# Patient Record
Sex: Female | Born: 1995 | Hispanic: Yes | Marital: Single | Smoking: Never smoker
Health system: Southern US, Community
[De-identification: ages and names within clinical notes are randomized; demographics above are authoritative.]

## PROBLEM LIST (undated history)

## (undated) ENCOUNTER — Inpatient Hospital Stay (HOSPITAL_COMMUNITY): Payer: Self-pay

## (undated) DIAGNOSIS — O24419 Gestational diabetes mellitus in pregnancy, unspecified control: Secondary | ICD-10-CM

## (undated) DIAGNOSIS — Z789 Other specified health status: Secondary | ICD-10-CM

## (undated) HISTORY — DX: Gestational diabetes mellitus in pregnancy, unspecified control: O24.419

## (undated) HISTORY — PX: NO PAST SURGERIES: SHX2092

---
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
## 2016-07-25 ENCOUNTER — Encounter: Payer: Self-pay | Admitting: Pediatrics

## 2016-07-26 ENCOUNTER — Encounter: Payer: Self-pay | Admitting: Pediatrics

## 2016-12-13 LAB — OB RESULTS CONSOLE HIV ANTIBODY (ROUTINE TESTING): HIV: NONREACTIVE

## 2016-12-13 LAB — OB RESULTS CONSOLE RUBELLA ANTIBODY, IGM: Rubella: IMMUNE

## 2016-12-13 LAB — OB RESULTS CONSOLE RPR: RPR: NONREACTIVE

## 2016-12-13 LAB — OB RESULTS CONSOLE HEPATITIS B SURFACE ANTIGEN: HEP B S AG: NEGATIVE

## 2016-12-13 LAB — OB RESULTS CONSOLE GC/CHLAMYDIA
CHLAMYDIA, DNA PROBE: NEGATIVE
GC PROBE AMP, GENITAL: NEGATIVE

## 2016-12-31 NOTE — L&D Delivery Note (Signed)
22 y.o. G2P1001 at 4569w4d admitted for SOL with SROM and Pitocin to augment, delivered a viable female infant at 1730 in cephalic, OA position. No nuchal cord. Right anterior shoulder delivered with ease. 60 sec delayed cord clamping. Cord clamped x2 and cut. Placenta delivered spontaneously intact, with 3VC. Fundus firm on exam with massage and pitocin. Good hemostasis noted.  Anesthesia: None Laceration: Second Degree Perineal Suture: 3.0 Vicryl Good hemostasis noted. EBL: 100 cc  Mom and baby recovering in LDR.    Apgars: APGAR (1 MIN): 9   APGAR (5 MINS): 9    Weight: Pending skin to skin  Sponge and instrument count were correct x2. Placenta sent to L&D.  Girl, Breast feeding, Condoms for contraception.  [place] Shirley, DO FM Resident PGY-1 07/04/2017 6:14 PM   OB FELLOW DELIVERY ATTESTATION  I was gloved and present for the delivery in its entirety, and I agree with the above resident's note.    Jen MowElizabeth Raheem Kolbe, DO OB Fellow

## 2017-06-17 LAB — OB RESULTS CONSOLE GBS: GBS: NEGATIVE

## 2017-06-28 ENCOUNTER — Inpatient Hospital Stay (HOSPITAL_COMMUNITY)
Admission: AD | Admit: 2017-06-28 | Discharge: 2017-06-29 | Disposition: A | Discharge status: Home / Self Care | Payer: Self-pay | Source: Ambulatory Visit | Attending: Obstetrics & Gynecology | Admitting: Obstetrics & Gynecology

## 2017-06-28 ENCOUNTER — Encounter (HOSPITAL_COMMUNITY): Payer: Self-pay | Admitting: *Deleted

## 2017-06-28 DIAGNOSIS — Z3A38 38 weeks gestation of pregnancy: Secondary | ICD-10-CM | POA: Insufficient documentation

## 2017-06-28 DIAGNOSIS — O368130 Decreased fetal movements, third trimester, not applicable or unspecified: Secondary | ICD-10-CM | POA: Insufficient documentation

## 2017-06-28 NOTE — MAU Note (Signed)
Has not felt FM since 0100. Denies LOF or bleeding. No pain

## 2017-06-29 DIAGNOSIS — O368130 Decreased fetal movements, third trimester, not applicable or unspecified: Secondary | ICD-10-CM

## 2017-06-29 NOTE — Discharge Instructions (Signed)
Evaluación de los movimientos fetales   (Fetal Movement Counts)  Nombre del paciente: __________________________________________________ Fecha de parto estimada: ____________________  La evaluación de los movimientos fetales es muy recomendable en los embarazos de alto riesgo, pero también es una buena idea que lo hagan todas las embarazadas. El médico le indicará que comience a contarlos a las 28 semanas de embarazo. Los movimientos fetales suelen aumentar:   · Después de una comida completa.  · Después de la actividad física.  · Después de comer o beber algo dulce o frío.  · En reposo.  Preste atención cuando sienta que el bebé está más activo. Esto le ayudará a notar un patrón de ciclos de vigilia y sueño de su bebé y cuáles son los factores que contribuyen a un aumento de los movimientos fetales. Es importante llevar a cabo un recuento de movimientos fetales, al mismo tiempo cada día, cuando el bebé normalmente está más activo.   CÓMO CONTAR LOS MOVIMIENTOS FETALES  1. Busque un lugar tranquilo y cómodo para sentarse o recostarse sobre el lado izquierdo. Al recostarse sobre su lado izquierdo, le proporciona una mejor circulación de sangre y oxígeno al bebé.  2. Anote el día y la hora en una hoja de papel o en un diario.  3. Comience contando las pataditas, revoloteos, chasquidos, vueltas o pinchazos en un período de 2 horas. Debe sentir al menos 10 movimientos en 2 horas.  4. Si no siente 10 movimientos en 2 horas, espere 2 ó 3 horas y cuente de nuevo. Busque cambios en el patrón o si no cuenta lo suficiente en 2 horas.  SOLICITE ATENCIÓN MÉDICA SI:   · Siente menos de 10 pataditas en 2 horas, en dos intentos.  · No hay movimientos durante una hora.  · El patrón se modifica o le lleva más tiempo cada día contar las 10 pataditas.  · Siente que el bebé no se mueve como lo hace habitualmente.  Fecha: ____________ Movimientos: ____________ Hora de inicio: ____________ Hora de finalización: ____________   Fecha:  ____________ Movimientos: ____________ Hora de inicio: ____________ Hora de finalización: ____________   Fecha: ____________ Movimientos: ____________ Hora de inicio: ____________ Hora de finalización: ____________   Fecha: ____________ Movimientos: ____________ Hora de inicio: ____________ Hora de finalización: ____________   Fecha: ____________ Movimientos: ____________ Hora de inicio: ____________ Hora de finalización: ____________   Fecha: ____________ Movimientos: ____________ Hora de inicio: ____________ Hora de finalización: ____________   Fecha: ____________ Movimientos: ____________ Hora de inicio: ____________ Hora de finalización: ____________   Fecha: ____________ Movimientos: ____________ Hora de inicio: ____________ Hora de finalización: ____________   Fecha: ____________ Movimientos: ____________ Hora de inicio: ____________ Hora de finalización: ____________   Fecha: ____________ Movimientos: ____________ Hora de inicio: ____________ Hora de finalización: ____________   Fecha: ____________ Movimientos: ____________ Hora de inicio: ____________ Hora de finalización: ____________   Fecha: ____________ Movimientos: ____________ Hora de inicio: ____________ Hora de finalización: ____________   Fecha: ____________ Movimientos: ____________ Hora de inicio: ____________ Hora de finalización: ____________   Fecha: ____________ Movimientos: ____________ Hora de inicio: ____________ Hora de finalización: ____________   Fecha: ____________ Movimientos: ____________ Hora de inicio: ____________ Hora de finalización: ____________   Fecha: ____________ Movimientos: ____________ Hora de inicio: ____________ Hora de finalización: ____________   Fecha: ____________ Movimientos: ____________ Hora de inicio: ____________ Hora de finalización: ____________   Fecha: ____________ Movimientos: ____________ Hora de inicio: ____________ Hora de finalización: ____________   Fecha: ____________ Movimientos: ____________ Hora  de inicio: ____________ Hora de finalización: ____________     Fecha: ____________ Movimientos: ____________ Hora de inicio: ____________ Hora de finalización: ____________   Fecha: ____________ Movimientos: ____________ Hora de inicio: ____________ Hora de finalización: ____________   Fecha: ____________ Movimientos: ____________ Hora de inicio: ____________ Hora de finalización: ____________   Fecha: ____________ Movimientos: ____________ Hora de inicio: ____________ Hora de finalización: ____________   Fecha: ____________ Movimientos: ____________ Hora de inicio: ____________ Hora de finalización: ____________   Fecha: ____________ Movimientos: ____________ Hora de inicio: ____________ Hora de finalización: ____________   Fecha: ____________ Movimientos: ____________ Hora de inicio: ____________ Hora de finalización: ____________   Fecha: ____________ Movimientos: ____________ Hora de inicio: ____________ Hora de finalización: ____________   Fecha: ____________ Movimientos: ____________ Hora de inicio: ____________ Hora de finalización: ____________   Fecha: ____________ Movimientos: ____________ Hora de inicio: ____________ Hora de finalización: ____________   Fecha: ____________ Movimientos: ____________ Hora de inicio: ____________ Hora de finalización: ____________   Fecha: ____________ Movimientos: ____________ Hora de inicio: ____________ Hora de finalización: ____________   Fecha: ____________ Movimientos: ____________ Hora de inicio: ____________ Hora de finalización: ____________   Fecha: ____________ Movimientos: ____________ Hora de inicio: ____________ Hora de finalización: ____________   Fecha: ____________ Movimientos: ____________ Hora de inicio: ____________ Hora de finalización: ____________   Fecha: ____________ Movimientos: ____________ Hora de inicio: ____________ Hora de finalización: ____________   Fecha: ____________ Movimientos: ____________ Hora de inicio: ____________ Hora de finalización:  ____________   Fecha: ____________ Movimientos: ____________ Hora de inicio: ____________ Hora de finalización: ____________   Fecha: ____________ Movimientos: ____________ Hora de inicio: ____________ Hora de finalización: ____________   Fecha: ____________ Movimientos: ____________ Hora de inicio: ____________ Hora de finalización: ____________   Fecha: ____________ Movimientos: ____________ Hora de inicio: ____________ Hora de finalización: ____________   Fecha: ____________ Movimientos: ____________ Hora de inicio: ____________ Hora de finalización: ____________   Fecha: ____________ Movimientos: ____________ Hora de inicio: ____________ Hora de finalización: ____________   Fecha: ____________ Movimientos: ____________ Hora de inicio: ____________ Hora de finalización: ____________   Fecha: ____________ Movimientos: ____________ Hora de inicio: ____________ Hora de finalización: ____________   Fecha: ____________ Movimientos: ____________ Hora de inicio: ____________ Hora de finalización: ____________   Fecha: ____________ Movimientos: ____________ Hora de inicio: ____________ Hora de finalización: ____________   Fecha: ____________ Movimientos: ____________ Hora de inicio: ____________ Hora de finalización: ____________   Fecha: ____________ Movimientos: ____________ Hora de inicio: ____________ Hora de finalización: ____________   Fecha: ____________ Movimientos: ____________ Hora de inicio: ____________ Hora de finalización: ____________   Fecha: ____________ Movimientos: ____________ Hora de inicio: ____________ Hora de finalización: ____________   Fecha: ____________ Movimientos: ____________ Hora de inicio: ____________ Hora de finalización: ____________   Fecha: ____________ Movimientos: ____________ Hora de inicio: ____________ Hora de finalización: ____________   Fecha: ____________ Movimientos: ____________ Hora de inicio: ____________ Hora de finalización: ____________   Fecha: ____________  Movimientos: ____________ Hora de inicio: ____________ Hora de finalización: ____________   Fecha: ____________ Movimientos: ____________ Hora de inicio: ____________ Hora de finalización: ____________   Fecha: ____________ Movimientos: ____________ Hora de inicio: ____________ Hora de finalización: ____________   Esta información no tiene como fin reemplazar el consejo del médico. Asegúrese de hacerle al médico cualquier pregunta que tenga.  Document Released: 03/25/2008 Document Revised: 12/03/2012  Elsevier Interactive Patient Education © 2017 Elsevier Inc.

## 2017-06-29 NOTE — MAU Provider Note (Signed)
Obstetric Attending MAU Note  Chief Complaint:  Decreased Fetal Movement   First Provider Initiated Contact with Patient 06/29/17 0010     HPI: Elaine Green is a 22 y.o. G2P1001 at [redacted]w[redacted]d who presents to maternity admissions reporting decreased fetal movement today.  Denies contractions, leakage of fluid or vaginal bleeding.   Pregnancy Course: Receives care at [facility] Patient Active Problem List   Diagnosis Date Noted  . Contraception management 10/23/2013    Past Medical History:  Diagnosis Date  . Medical history non-contributory     OB History  Gravida Para Term Preterm AB Living  2 1 1     1   SAB TAB Ectopic Multiple Live Births          1    # Outcome Date GA Lbr Len/2nd Weight Sex Delivery Anes PTL Lv  2 Current           1 Term 10/14/13 [redacted]w[redacted]d 19:17 / 00:34 7 lb 0.4 oz (3.185 kg) F Vag-Spont Local  LIV      Past Surgical History:  Procedure Laterality Date  . NO PAST SURGERIES      Family History: History reviewed. No pertinent family history.  Social History: Social History  Substance Use Topics  . Smoking status: Never Smoker  . Smokeless tobacco: Never Used  . Alcohol use No    Allergies: No Known Allergies  Prescriptions Prior to Admission  Medication Sig Dispense Refill Last Dose  . Acetaminophen 650 MG TABS Take 1 tablet (650 mg total) by mouth every 6 (six) hours as needed (pain). 40 tablet 0   . chlorhexidine (PERIDEX) 0.12 % solution Use as directed 15 mLs in the mouth or throat 2 (two) times daily. 120 mL 0   . ibuprofen (ADVIL,MOTRIN) 600 MG tablet Take 1 tablet (600 mg total) by mouth every 6 (six) hours. 30 tablet 0 10/30/2013 at Unknown time  . medroxyPROGESTERone (DEPO-PROVERA) 150 MG/ML injection Inject 1 mL (150 mg total) into the muscle once. 1 mL  10-26-13  . Prenatal Vit-Fe Fumarate-FA (MULTIVITAMIN-PRENATAL) 27-0.8 MG TABS tablet Take 1 tablet by mouth daily at 12 noon.   10/30/2013 at Unknown time    ROS: Pertinent  findings in history of present illness.  Physical Exam  Blood pressure 105/65, pulse 81, temperature 98.1 F (36.7 C), resp. rate 16, height 5\' 2"  (1.575 m), weight 219 lb (99.3 kg), currently breastfeeding. CONSTITUTIONAL: Well-developed, well-nourished female in no acute distress.  HENT:  Normocephalic, atraumatic, External right and left ear normal. Oropharynx is clear and moist EYES: Conjunctivae and EOM are normal. Pupils are equal, round, and reactive to light. No scleral icterus.  NECK: Normal range of motion, supple, no masses SKIN: Skin is warm and dry. No rash noted. Not diaphoretic. No erythema. No pallor. NEUROLGIC: Alert and oriented to person, place, and time. Normal reflexes, muscle tone coordination. No cranial nerve deficit noted. PSYCHIATRIC: Normal mood and affect. Normal behavior. Normal judgment and thought content. CARDIOVASCULAR: Normal heart rate noted, regular rhythm RESPIRATORY: Effort and breath sounds normal, no problems with respiration noted ABDOMEN: Soft, nontender, nondistended, gravid appropriate for gestational age MUSCULOSKELETAL: Normal range of motion. No edema and no tenderness. 2+ distal pulses.  SPECULUM EXAM: Deferred    FHT:  Baseline 140 , moderate variability, accelerations present, no decelerations Contractions:None   Labs: No results found for this or any previous visit (from the past 24 hour(s)).  Imaging:  No results found.  MAU Course: Reactive NST, patient started  to feel movement while here.  Assessment: 1. Decreased fetal movements in third trimester, single or unspecified fetus     Plan: Discharge home Labor precautions and fetal kick counts reviewed Follow up with OB provider    Allergies as of 06/29/2017   No Known Allergies     Medication List    TAKE these medications   Acetaminophen 650 MG Tabs Take 1 tablet (650 mg total) by mouth every 6 (six) hours as needed (pain).   chlorhexidine 0.12 %  solution Commonly known as:  PERIDEX Use as directed 15 mLs in the mouth or throat 2 (two) times daily.   ibuprofen 600 MG tablet Commonly known as:  ADVIL,MOTRIN Take 1 tablet (600 mg total) by mouth every 6 (six) hours.   medroxyPROGESTERone 150 MG/ML injection Commonly known as:  DEPO-PROVERA Inject 1 mL (150 mg total) into the muscle once.   multivitamin-prenatal 27-0.8 MG Tabs tablet Take 1 tablet by mouth daily at 12 noon.       Tereso NewcomerAnyanwu, Constance Hackenberg A, MD 06/29/2017 12:12 AM

## 2017-07-04 ENCOUNTER — Inpatient Hospital Stay (HOSPITAL_COMMUNITY)
Admission: AD | Admit: 2017-07-04 | Discharge: 2017-07-06 | DRG: 775 | Disposition: A | Discharge status: Home / Self Care | Payer: Medicaid Other | Source: Ambulatory Visit | Attending: Obstetrics and Gynecology | Admitting: Obstetrics and Gynecology

## 2017-07-04 ENCOUNTER — Encounter (HOSPITAL_COMMUNITY): Payer: Self-pay | Admitting: *Deleted

## 2017-07-04 DIAGNOSIS — O4292 Full-term premature rupture of membranes, unspecified as to length of time between rupture and onset of labor: Secondary | ICD-10-CM | POA: Diagnosis present

## 2017-07-04 DIAGNOSIS — O4290 Premature rupture of membranes, unspecified as to length of time between rupture and onset of labor, unspecified weeks of gestation: Secondary | ICD-10-CM | POA: Diagnosis present

## 2017-07-04 DIAGNOSIS — Z3493 Encounter for supervision of normal pregnancy, unspecified, third trimester: Secondary | ICD-10-CM | POA: Diagnosis present

## 2017-07-04 DIAGNOSIS — Z3A39 39 weeks gestation of pregnancy: Secondary | ICD-10-CM

## 2017-07-04 LAB — CBC
HCT: 36.1 % (ref 36.0–46.0)
HEMOGLOBIN: 12.3 g/dL (ref 12.0–15.0)
MCH: 30.7 pg (ref 26.0–34.0)
MCHC: 34.1 g/dL (ref 30.0–36.0)
MCV: 90 fL (ref 78.0–100.0)
Platelets: 154 10*3/uL (ref 150–400)
RBC: 4.01 MIL/uL (ref 3.87–5.11)
RDW: 14.3 % (ref 11.5–15.5)
WBC: 12.8 10*3/uL — ABNORMAL HIGH (ref 4.0–10.5)

## 2017-07-04 LAB — TYPE AND SCREEN
ABO/RH(D): O POS
ANTIBODY SCREEN: NEGATIVE

## 2017-07-04 LAB — POCT FERN TEST: POCT FERN TEST: POSITIVE

## 2017-07-04 MED ORDER — OXYTOCIN 40 UNITS IN LACTATED RINGERS INFUSION - SIMPLE MED
1.0000 m[IU]/min | INTRAVENOUS | Status: DC
  Administered 2017-07-04: 2 m[IU]/min via INTRAVENOUS
  Filled 2017-07-04: qty 1000

## 2017-07-04 MED ORDER — SOD CITRATE-CITRIC ACID 500-334 MG/5ML PO SOLN: 30.0000 mL | ORAL | Status: DC | PRN

## 2017-07-04 MED ORDER — ONDANSETRON HCL 4 MG/2ML IJ SOLN: 4.0000 mg | INTRAMUSCULAR | Status: DC | PRN

## 2017-07-04 MED ORDER — OXYTOCIN BOLUS FROM INFUSION
500.0000 mL | Freq: Once | INTRAVENOUS | Status: AC
  Administered 2017-07-04: 500 mL via INTRAVENOUS

## 2017-07-04 MED ORDER — COCONUT OIL OIL: 1.0000 "application " | TOPICAL_OIL | Status: DC | PRN

## 2017-07-04 MED ORDER — WITCH HAZEL-GLYCERIN EX PADS: 1.0000 "application " | MEDICATED_PAD | CUTANEOUS | Status: DC | PRN

## 2017-07-04 MED ORDER — LACTATED RINGERS IV SOLN
INTRAVENOUS | Status: DC
  Administered 2017-07-04: 12:00:00 via INTRAVENOUS

## 2017-07-04 MED ORDER — SENNOSIDES-DOCUSATE SODIUM 8.6-50 MG PO TABS
2.0000 | ORAL_TABLET | ORAL | Status: DC
  Administered 2017-07-05 – 2017-07-06 (×2): 2 via ORAL
  Filled 2017-07-04 (×2): qty 2

## 2017-07-04 MED ORDER — DIPHENHYDRAMINE HCL 50 MG/ML IJ SOLN: 12.5000 mg | INTRAMUSCULAR | Status: DC | PRN

## 2017-07-04 MED ORDER — PRENATAL MULTIVITAMIN CH
1.0000 | ORAL_TABLET | Freq: Every day | ORAL | Status: DC
  Administered 2017-07-05: 1 via ORAL
  Filled 2017-07-04: qty 1

## 2017-07-04 MED ORDER — LACTATED RINGERS IV SOLN: 500.0000 mL | Freq: Once | INTRAVENOUS | Status: DC

## 2017-07-04 MED ORDER — EPHEDRINE 5 MG/ML INJ
10.0000 mg | INTRAVENOUS | Status: DC | PRN
  Filled 2017-07-04: qty 2

## 2017-07-04 MED ORDER — DIPHENHYDRAMINE HCL 25 MG PO CAPS: 25.0000 mg | ORAL_CAPSULE | Freq: Four times a day (QID) | ORAL | Status: DC | PRN

## 2017-07-04 MED ORDER — LACTATED RINGERS IV SOLN: INTRAVENOUS | Status: DC

## 2017-07-04 MED ORDER — FLEET ENEMA 7-19 GM/118ML RE ENEM: 1.0000 | ENEMA | RECTAL | Status: DC | PRN

## 2017-07-04 MED ORDER — TETANUS-DIPHTH-ACELL PERTUSSIS 5-2.5-18.5 LF-MCG/0.5 IM SUSP: 0.5000 mL | Freq: Once | INTRAMUSCULAR | Status: DC

## 2017-07-04 MED ORDER — IBUPROFEN 600 MG PO TABS
600.0000 mg | ORAL_TABLET | Freq: Four times a day (QID) | ORAL | Status: DC
  Administered 2017-07-04 – 2017-07-06 (×6): 600 mg via ORAL
  Filled 2017-07-04 (×7): qty 1

## 2017-07-04 MED ORDER — TERBUTALINE SULFATE 1 MG/ML IJ SOLN
0.2500 mg | Freq: Once | INTRAMUSCULAR | Status: DC | PRN
  Filled 2017-07-04: qty 1

## 2017-07-04 MED ORDER — OXYCODONE-ACETAMINOPHEN 5-325 MG PO TABS: 1.0000 | ORAL_TABLET | ORAL | Status: DC | PRN

## 2017-07-04 MED ORDER — ACETAMINOPHEN 325 MG PO TABS: 650.0000 mg | ORAL_TABLET | ORAL | Status: DC | PRN

## 2017-07-04 MED ORDER — LIDOCAINE HCL (PF) 1 % IJ SOLN
30.0000 mL | INTRAMUSCULAR | Status: DC | PRN
  Administered 2017-07-04: 30 mL via SUBCUTANEOUS
  Filled 2017-07-04: qty 30

## 2017-07-04 MED ORDER — ONDANSETRON HCL 4 MG/2ML IJ SOLN: 4.0000 mg | Freq: Four times a day (QID) | INTRAMUSCULAR | Status: DC | PRN

## 2017-07-04 MED ORDER — LACTATED RINGERS IV SOLN: 500.0000 mL | INTRAVENOUS | Status: DC | PRN

## 2017-07-04 MED ORDER — DIBUCAINE 1 % RE OINT: 1.0000 "application " | TOPICAL_OINTMENT | RECTAL | Status: DC | PRN

## 2017-07-04 MED ORDER — OXYCODONE-ACETAMINOPHEN 5-325 MG PO TABS
2.0000 | ORAL_TABLET | ORAL | Status: DC | PRN
Start: 2017-07-04 — End: 2017-07-05

## 2017-07-04 MED ORDER — PHENYLEPHRINE 40 MCG/ML (10ML) SYRINGE FOR IV PUSH (FOR BLOOD PRESSURE SUPPORT)
80.0000 ug | PREFILLED_SYRINGE | INTRAVENOUS | Status: DC | PRN
  Filled 2017-07-04: qty 5

## 2017-07-04 MED ORDER — FENTANYL 2.5 MCG/ML BUPIVACAINE 1/10 % EPIDURAL INFUSION (WH - ANES): 14.0000 mL/h | INTRAMUSCULAR | Status: DC | PRN

## 2017-07-04 MED ORDER — BENZOCAINE-MENTHOL 20-0.5 % EX AERO
1.0000 "application " | INHALATION_SPRAY | CUTANEOUS | Status: DC | PRN
  Administered 2017-07-06: 1 via TOPICAL
  Filled 2017-07-04: qty 56

## 2017-07-04 MED ORDER — ONDANSETRON HCL 4 MG PO TABS: 4.0000 mg | ORAL_TABLET | ORAL | Status: DC | PRN

## 2017-07-04 MED ORDER — OXYTOCIN 40 UNITS IN LACTATED RINGERS INFUSION - SIMPLE MED
2.5000 [IU]/h | INTRAVENOUS | Status: DC
  Administered 2017-07-04: 2.5 [IU]/h via INTRAVENOUS

## 2017-07-04 MED ORDER — SIMETHICONE 80 MG PO CHEW: 80.0000 mg | CHEWABLE_TABLET | ORAL | Status: DC | PRN

## 2017-07-04 MED ORDER — ZOLPIDEM TARTRATE 5 MG PO TABS: 5.0000 mg | ORAL_TABLET | Freq: Every evening | ORAL | Status: DC | PRN

## 2017-07-04 MED ORDER — FENTANYL CITRATE (PF) 100 MCG/2ML IJ SOLN
100.0000 ug | INTRAMUSCULAR | Status: DC | PRN
  Administered 2017-07-04: 100 ug via INTRAVENOUS
  Filled 2017-07-04: qty 2

## 2017-07-04 NOTE — Progress Notes (Signed)
Patient ID: Elaine Green, female   DOB: 03-19-1995, 22 y.o.   MRN: [id number] Labor Progress Note  S: Patient seen & examined for progress of labor. Patient not using epidural.   O: BP (!) 111/49    Pulse 88    Temp 98.5 F (36.9 C) (Oral)    Resp 20    Ht 5\' 2"  (1.575 m)    Wt 98 kg (216 lb)    SpO2 100%    BMI 39.51 kg/m   FHT: 140bpm, mod var, +accels, no decels TOCO: q3-8 min, patient looks comfortable during contractions  CVE: Dilation: 4 (to pt's left side) Effacement (%): 70 Cervical Position: Posterior, Middle Station: -3 Presentation: Vertex Exam by:: Dr. Talbert ForestShirley, Dr. Omer JackMumaw   A&P: 22 y.o. G2P1001 6281w4d   Currently on pitocin 402milli-units/ min Continue expectant management and augmentation Anticipate SVD  [place] Shirley, DO FM Resident PGY-1 07/04/2017 3:11 PM

## 2017-07-04 NOTE — H&P (Signed)
LABOR ADMISSION HISTORY AND PHYSICAL  Elaine Green is a 22 y.o. female G2P1001 with IUP at 1191w4d by LMP presenting for SROM.   Spanish interpreter present in room. [facility] Leakage of fluids since 3am this morning. Fern done and positive in MAU.  She reports +FMs, contractions every 10 minutes starting at 10am this morning. She denies bloody discharge but does report what she describes as pink discharge. Some headaches. No blurry vision, peripheral edema, or RUQ pain.  She plans on breast and bottle feeding. She request condoms for birth control.  Dating: By LMP --->  Estimated Date of Delivery: 07/07/17  Sono:   @[redacted]w[redacted]d , CWD, normal anatomy, cephalic presentation   Prenatal History/Complications:  Past Medical History: Past Medical History:  Diagnosis Date  . Medical history non-contributory     Past Surgical History: Past Surgical History:  Procedure Laterality Date  . NO PAST SURGERIES      Obstetrical History: OB History    Gravida Para Term Preterm AB Living   2 1 1  0 0 1   SAB TAB Ectopic Multiple Live Births   0 0 0 0 1      Social History: Social History   Social History  . Marital status: Single    Spouse name: N/A  . Number of children: N/A  . Years of education: N/A   Social History Main Topics  . Smoking status: Never Smoker  . Smokeless tobacco: Never Used  . Alcohol use No  . Drug use: No  . Sexual activity: Yes    Birth control/ protection: None   Other Topics Concern  . None   Social History Narrative  . None    Family History: History reviewed. No pertinent family history.  Allergies: No Known Allergies  Prescriptions Prior to Admission  Medication Sig Dispense Refill Last Dose  . Prenatal Vit-Fe Fumarate-FA (MULTIVITAMIN-PRENATAL) 27-0.8 MG TABS tablet Take 1 tablet by mouth daily at 12 noon.   07/03/2017 at Unknown time     Review of Systems   All systems reviewed and negative  except as stated in HPI  Blood pressure 111/67, pulse 90, temperature 98.9 F (37.2 C), temperature source Oral, resp. rate 20, height 5\' 2"  (1.575 m), weight 98 kg (216 lb), SpO2 100 %, currently breastfeeding. General appearance: alert, cooperative and no distress Presentation: cephalic Fetal monitoring: Baseline: 135 bpm, Variability: Good {> 6 bpm), Accelerations: Reactive and Decelerations: Absent Uterine activity Date/time of onset: 10am, Frequency: Every 2-5 minutes, Duration: 60-70 seconds and Intensity: moderate    Prenatal labs: ABO, Rh: O positive Antibody: Negative Rubella: Immune RPR: Negative HBsAg: Negative HIV: Nonreactive GBS: Negative 1 hr Glucola: 115, Normal Anatomy US: Normal anatomy Quad screen: Negative  Prenatal Transfer Tool  Maternal Diabetes: No Genetic Screening: Declined Maternal Ultrasounds/Referrals: Normal Fetal Ultrasounds or other Referrals:  None Maternal Substance Abuse:  No Significant Maternal Medications:  None Significant Maternal Lab Results: Lab values include: Group B Strep negative  Results for orders placed or performed during the hospital encounter of 07/04/17 (from the past 24 hour(s))  Fern Test   Collection Time: 07/04/17 11:29 AM  Result Value Ref Range   POCT Fern Test Positive = ruptured amniotic membanes     Patient Active Problem List   Diagnosis Date Noted  . PROM (premature rupture of membranes) 07/04/2017  . Contraception management 10/23/2013    Assessment: Elaine Green is a 22 y.o. G2P1001 at 2091w4d here for SROM/SOL.  Plan: #Labor: 4 cm upon arrival. Strong contractions now but will consider augmentation if necessary. #Pain: No epidural #ID: GBS negative #FWB: Cat 1 #MOF: Breast and bottle #MOC: Condoms   Jeanie Cooks, Medical Student 07/04/2017, 12:49 PM  OB FELLOW HISTORY AND PHYSICAL ATTESTATION  I have seen and examined this patient; I agree with above documentation in the  student's note. I saw the patient independently from the student and performed my own physical exam findings and agree with the above note.   Jen Mow, DO OB Fellow

## 2017-07-04 NOTE — Progress Notes (Signed)
Labor Progress Note  This is a late progress note. Patient seen at 5:15pm.  S: Patient having increased pain and feeling more uncomfortable. Cramping in front and back. Denies the urge to feel like she has to push.   O:  BP 128/86   Pulse 76   Temp 98.6 F (37 C) (Oral)   Resp 20   Ht 5\' 2"  (1.575 m)   Wt 98 kg (216 lb)   SpO2 100%   Breastfeeding? Unknown   BMI 39.51 kg/m    Cat 1. Baseline 140, moderate variability, accelerations present, no decelerations.  CVE: Dilation: 8 Effacement: 100% Cervical position: Anterior Station: +1 Presentation: Vertex Exam by: Enis SlipperJane Bailey, RN   A&P: 22 y.o. [id number] [id number] 1025G2P2002 5821w4d  Continue with expectant management Anticipate SVD soon.   Jeanie CooksSarah Ashyra Cantin, Medical Student 7:17 PM

## 2017-07-04 NOTE — MAU Note (Signed)
Patient sent from [facility] for evaluation of rupture of membranes.  Leaking of clear fluid since 3am. States has continued to leak.  Bloody streaks in fluid.  In call report from [facility] nurse; rn was told patient was 3cm/90%/vertex by Lanier Prudeerri B. NP. +FM

## 2017-07-04 NOTE — Progress Notes (Signed)
Labor Progress Note  S: Patient reporting decreased contractions.  O:  BP (!) 111/49   Pulse 88   Temp 98.5 F (36.9 C) (Oral)   Resp 20   Ht 5\' 2"  (1.575 m)   Wt 98 kg (216 lb)   SpO2 100%   BMI 39.51 kg/m  Cat 1 CVE: Dilation: 4 (to pt's left side) Effacement (%): 70 Cervical Position: Posterior, Middle Station: -3 Presentation: Vertex Exam by:: Dr. Talbert ForestShirley, Dr. Omer JackMumaw  A&P: 22 y.o. G2P1001 7017w4d  Contractions decreased to 7 minutes apart Pitocin started at 14:27 to increase contractions Continue expectant management Anticipate SVD  Elaine CooksSarah Kaeden Green, Medical Student 3:13 PM

## 2017-07-04 NOTE — Anesthesia Pain Management Evaluation Note (Signed)
  CRNA Pain Management Visit Note  Patient: Elaine HoppingLemny Karen Morales Martinez, 22 y.o., female  "Hello I am a member of the anesthesia team at [facility]. We have an anesthesia team available at all times to provide care throughout the hospital, including epidural management and anesthesia for C-section. I don't know your plan for the delivery whether it a natural birth, water birth, IV sedation, nitrous supplementation, doula or epidural, but we want to meet your pain goals."   1.Was your pain managed to your expectations on prior hospitalizations?   Yes   2.What is your expectation for pain management during this hospitalization?     Labor support without medications  3.How can we help you reach that goal?   Record the patient's initial score and the patient's pain goal.   Pain: 6  Pain Goal: 10 [facility] wants you to be able to say your pain was always managed very well.  Laban EmperorMalinova,Nimra Puccinelli Hristova 07/04/2017

## 2017-07-04 NOTE — Progress Notes (Addendum)
G2P1 @ 39.[redacted] wksga. [facility] +[facility] @ 03 and clear. [facility] 3/90 [facility]. +FM  EFM applied. Fern done and positive.   GBS negative done on 06/03/2017  1137: Provider notified. Report status of pt given. Orders received to admit  1139: Birthing charge nurse notified and report given. Room assigned to 168  1149: Labs and IV started.   1205: Pt to birthing suite via wheelchair.

## 2017-07-05 LAB — RPR: RPR Ser Ql: NONREACTIVE

## 2017-07-05 NOTE — Progress Notes (Signed)
UR chart review completed.  

## 2017-07-05 NOTE — Progress Notes (Signed)
Spanish interpreter, Pamala HurryVeria, in to translate while nurse reviewed plan of care, scheduled medications for patient and for her infant. Translator inquired if patient had any needs including PRN pain medications. Patient denied any needs at this time.

## 2017-07-05 NOTE — Progress Notes (Signed)
POSTPARTUM PROGRESS NOTE  Post Partum Day #1 SVD  Subjective:  Elaine Green is a 22 y.o. [phone] 545w4d s/p SVD.  No acute events overnight.  Pt denies problems with ambulating, voiding or po intake.  She denies nausea or vomiting.  Pain is well controlled.  She has not had flatus. She has not had bowel movement.  Lochia Minimal.   Objective: Blood pressure (!) 103/55, pulse 87, temperature 99.3 F (37.4 C), temperature source Oral, resp. rate 18, height 5\' 2"  (1.575 m), weight 98 kg (216 lb), SpO2 100 %, unknown if currently breastfeeding.  Physical Exam:  General: alert, cooperative and no distress Lochia:normal flow Uterine Fundus: firm below umbilicus DVT Evaluation: No calf swelling or tenderness Extremities: no LE edema   Recent Labs  07/04/17 1150  HGB 12.3  HCT 36.1    Assessment/Plan:  ASSESSMENT: Elaine Green is a 22 y.o. [phone] 325w4d s/p SVD  Plan for discharge tomorrow   LOS: 1 day   Howard PouchLauren Feng, MD PGY-2 [facility]  07/05/2017, 5:58 AM   OB FELLOW POSTPARTUM PROGRESS NOTE ATTESTATION  I have seen and examined this patient and agree with above documentation in the resident's note.   Jen MowElizabeth Mumaw, DO OB Fellow 8:48 AM

## 2017-07-05 NOTE — Lactation Note (Signed)
This note was copied from a baby's chart. Lactation Consultation Note  Patient Name: Elaine Green Today's Date: 07/05/2017 Reason for consult: Initial assessment   Initial assessment with Exp BF mom of 22 year old infant. Spoke with mom with assistance of Viria, [employer] Interpreter.   Infant with 2 BF for 15-25 minutes, 2 attempts, formula x 2 of 10-15 cc, 4 voids and 3 stools since birth. LATCH score 6. Infant weight 7 lb 2.8 oz with 3% weight loss since birth. Mom pulled infant off the breast [facility] and interpreter entered room. Mom's nipple was noted to be slightly compressed. Mom reports slight pain with BF that she said she felt was normal.  Enc mom to deepen infant latch to promote milk transfer. Mom voiced understanding. Mom did not want assistance with BF at this time.   Reviewed colostrum, milk coming to volume, cluster feeding, and supply and demand. Mom is planning to offer formula to infant, enc mom to offer breast prior to formula to promote milk production.  Mom Bf her 22 yo for 15 months, she denies any problems. Enc mom to feed infant at the breast 8-12 x in 24 hours. Enc mom to massage/compress breast with feeding. Advised mom to call out for feeding assistance as needed.   Mom with soft compressible breasts with small everted nipples. Mom is able to hand express colostrum. BF Resources handout and LC Brochure given, mom informed of IP/OP Services, BF Support groups and LC phone #. Mom does not have a pump at home. [facility] and is aware to call and make appt post d/c. Mom has no questions/concerns at this time.      Maternal Data Formula Feeding for Exclusion: Yes Reason for exclusion: Mother's choice to formula and breast feed on admission Has patient been taught Hand Expression?: Yes Does the patient have breastfeeding experience prior to this delivery?: Yes  Feeding    LATCH Score/Interventions                       Lactation Tools Discussed/Used WIC Program: Yes   Consult Status Consult Status: Follow-up Date: 07/06/17 Follow-up type: In-patient    Silas FloodSharon S Taivon Haroon 07/05/2017, 3:52 PM

## 2017-07-06 MED ORDER — SENNOSIDES-DOCUSATE SODIUM 8.6-50 MG PO TABS: 1.0000 | ORAL_TABLET | Freq: Every evening | ORAL | 0 refills | Status: DC | PRN

## 2017-07-06 MED ORDER — IBUPROFEN 600 MG PO TABS: 600.0000 mg | ORAL_TABLET | Freq: Four times a day (QID) | ORAL | 0 refills | Status: DC

## 2017-07-06 NOTE — Lactation Note (Signed)
This note was copied from a baby's chart. Lactation Consultation Note  Patient Name: Elaine Green Today's Date: 07/06/2017    Visited with Mom on day of discharge, baby 9344 hrs old.  Milk volume increasing, and breasts are full.  With interpreter, talked about importance of frequent feedings at the breast.  If breasts become so full that baby can not latch and soften the breast, recommended 20 mins of ice packs on breasts to reduce swelling.  Mom has a manual pump to pre-pump.   Mom denies needing any help. Encouraged Mom to call prn for concerns.    Judee ClaraSmith, Haliey Romberg E 07/06/2017, 2:29 PM

## 2017-07-06 NOTE — Discharge Summary (Signed)
OB Discharge Summary     Patient Name: Elaine Green DOB: Mar 31, 1995 MRN: [id number]  Date of admission: 07/04/2017 Delivering MD: Jen Mow [facility]   Date of discharge: 07/06/2017  Admitting diagnosis: 39WKS, WATER BROKE Intrauterine pregnancy: [redacted]w[redacted]d     Secondary diagnosis:  Active Problems:   PROM (premature rupture of membranes)   NSVD (normal spontaneous vaginal delivery)  Additional problems:  Patient Active Problem List   Diagnosis Date Noted  . PROM (premature rupture of membranes) 07/04/2017  . NSVD (normal spontaneous vaginal delivery) 07/04/2017  . Contraception management 10/23/2013       Discharge diagnosis: Term Pregnancy Delivered                                                                                                Post partum procedures: none  Augmentation: Pitocin  Complications: None  Hospital course:  Onset of Labor With Vaginal Delivery     22 y.o. yo [id number] at [redacted]w[redacted]d was admitted after SROM in spontaneous labor on 07/04/2017. Patient had an uncomplicated labor course as follows:  Membrane Rupture Time/Date: 10:00 AM ,07/04/2017   Intrapartum Procedures: Episiotomy: None [1]                                         Lacerations:  Perineal [11];2nd degree [3]  Patient had a delivery of a Viable infant. 07/04/2017  Information for the patient's newborn:  Quincey Nored, Girl Surya [[id number]]  Delivery Method: Vag-Spont    Pateint had an uncomplicated postpartum course.  She is ambulating, tolerating a regular diet, passing flatus, and urinating well. Patient is discharged home in stable condition on 07/06/17.   Physical exam  Vitals:   07/05/17 0603 07/05/17 1230 07/05/17 1800 07/06/17 0547  BP: (!) 96/44 (!) 103/56 106/64 138/79  Pulse: 74 97 88 89  Resp: 18  16 16   Temp: 98.1 F (36.7 C)  97.9 F (36.6 C) 97.7 F (36.5 C)  TempSrc: Oral  Axillary Oral  SpO2:      Weight:      Height:       General: alert,  cooperative and no distress Lochia: appropriate Uterine Fundus: firm Incision: N/A DVT Evaluation: No evidence of DVT seen on physical exam. Labs: Lab Results  Component Value Date   WBC 12.8 (H) 07/04/2017   HGB 12.3 07/04/2017   HCT 36.1 07/04/2017   MCV 90.0 07/04/2017   PLT 154 07/04/2017   No flowsheet data found.  Discharge instruction: per After Visit Summary and "Baby and Me Booklet".  After visit meds:  Allergies as of 07/06/2017   No Known Allergies     Medication List    TAKE these medications   ibuprofen 600 MG tablet Commonly known as:  ADVIL,MOTRIN Take 1 tablet (600 mg total) by mouth every 6 (six) hours.   multivitamin-prenatal 27-0.8 MG Tabs tablet Take 1 tablet by mouth daily at 12 noon.   senna-docusate 8.6-50 MG tablet Commonly known as:  Senokot-S Take  1 tablet by mouth at bedtime as needed for mild constipation.       Diet: routine diet  Activity: Advance as tolerated. Pelvic rest for 6 weeks.   Outpatient follow up:6 weeks Follow up Appt:No future appointments. Follow up Visit:No Follow-up on file.  Postpartum contraception: Condoms but states she will consider LARCs  Newborn Data: Live born female  Birth Weight: 7 lb 6.7 oz (3365 g) APGAR: 9, 9  Baby Feeding: Breast Disposition:home with mother  Howard PouchLauren Feng, MD PGY-2 [facility]  Patient was seen and examined by me also Agree with note Vitals stable Labs stable Fundus firm, lochia within normal limits Perineum healing Ext WNL Continue care  Ready for discharge  Elaine Green, Elaine Green, CNM

## 2017-07-06 NOTE — Discharge Instructions (Signed)

## 2021-06-01 ENCOUNTER — Ambulatory Visit: Payer: Self-pay | Admitting: Family Medicine

## 2021-07-24 ENCOUNTER — Encounter: Payer: Self-pay | Admitting: Family Medicine

## 2021-07-24 ENCOUNTER — Other Ambulatory Visit: Payer: Self-pay

## 2021-07-24 ENCOUNTER — Ambulatory Visit (INDEPENDENT_AMBULATORY_CARE_PROVIDER_SITE_OTHER): Payer: Self-pay | Admitting: General Practice

## 2021-07-24 VITALS — BP 114/72 | HR 75 | Wt 250.0 lb

## 2021-07-24 DIAGNOSIS — Z3201 Encounter for pregnancy test, result positive: Secondary | ICD-10-CM

## 2021-07-24 LAB — POCT PREGNANCY, URINE: Preg Test, Ur: POSITIVE — AB

## 2021-07-24 NOTE — Progress Notes (Signed)
Patient presents to office today for UPT. UPT +. Patient reports first positive home test 07/09/21. LMP 06/13/21 EDD 03/20/22 [redacted]w[redacted]d. Currently taking PNV. Patient denies remarkable obstetric or medical history. She does endorse some headaches. Recommended she drink around 2 liters of water a day, eat small frequent meals, & try tylenol. Advised to call us if headaches do not improve. Patient will return to office for new OB intake & appt.   Chase Caller RN BSN 07/24/21

## 2021-08-06 ENCOUNTER — Other Ambulatory Visit: Payer: Self-pay

## 2021-08-06 ENCOUNTER — Inpatient Hospital Stay (HOSPITAL_COMMUNITY): Payer: Self-pay

## 2021-08-06 ENCOUNTER — Inpatient Hospital Stay (HOSPITAL_COMMUNITY)
Admission: AD | Admit: 2021-08-06 | Discharge: 2021-08-06 | Disposition: A | Discharge status: Home / Self Care | Payer: Self-pay | Attending: Obstetrics and Gynecology | Admitting: Obstetrics and Gynecology

## 2021-08-06 ENCOUNTER — Encounter (HOSPITAL_COMMUNITY): Payer: Self-pay

## 2021-08-06 DIAGNOSIS — O26899 Other specified pregnancy related conditions, unspecified trimester: Secondary | ICD-10-CM

## 2021-08-06 DIAGNOSIS — Z789 Other specified health status: Secondary | ICD-10-CM

## 2021-08-06 DIAGNOSIS — O26891 Other specified pregnancy related conditions, first trimester: Secondary | ICD-10-CM | POA: Insufficient documentation

## 2021-08-06 DIAGNOSIS — R1030 Lower abdominal pain, unspecified: Secondary | ICD-10-CM | POA: Insufficient documentation

## 2021-08-06 DIAGNOSIS — Z3A08 8 weeks gestation of pregnancy: Secondary | ICD-10-CM | POA: Insufficient documentation

## 2021-08-06 DIAGNOSIS — Z3491 Encounter for supervision of normal pregnancy, unspecified, first trimester: Secondary | ICD-10-CM

## 2021-08-06 DIAGNOSIS — R109 Unspecified abdominal pain: Secondary | ICD-10-CM

## 2021-08-06 LAB — URINALYSIS, ROUTINE W REFLEX MICROSCOPIC
Bilirubin Urine: NEGATIVE
Glucose, UA: NEGATIVE mg/dL
Hgb urine dipstick: NEGATIVE
Ketones, ur: NEGATIVE mg/dL
Leukocytes,Ua: NEGATIVE
Nitrite: NEGATIVE
Protein, ur: NEGATIVE mg/dL
Specific Gravity, Urine: 1.023 (ref 1.005–1.030)
pH: 6 (ref 5.0–8.0)

## 2021-08-06 LAB — CBC
HCT: 38 % (ref 36.0–46.0)
Hemoglobin: 12.8 g/dL (ref 12.0–15.0)
MCH: 29.8 pg (ref 26.0–34.0)
MCHC: 33.7 g/dL (ref 30.0–36.0)
MCV: 88.4 fL (ref 80.0–100.0)
Platelets: 217 10*3/uL (ref 150–400)
RBC: 4.3 MIL/uL (ref 3.87–5.11)
RDW: 12.8 % (ref 11.5–15.5)
WBC: 14.9 10*3/uL — ABNORMAL HIGH (ref 4.0–10.5)
nRBC: 0 % (ref 0.0–0.2)

## 2021-08-06 LAB — WET PREP, GENITAL
Clue Cells Wet Prep HPF POC: NONE SEEN
Sperm: NONE SEEN
Trich, Wet Prep: NONE SEEN
Yeast Wet Prep HPF POC: NONE SEEN

## 2021-08-06 LAB — HCG, QUANTITATIVE, PREGNANCY: hCG, Beta Chain, Quant, S: 101966 m[IU]/mL — ABNORMAL HIGH (ref <5)

## 2021-08-06 NOTE — MAU Provider Note (Signed)
Chief Complaint:  Abdominal Pain and Nausea    HPI: Elaine Green is a 26 y.o. G3P2002 at [redacted]w[redacted]d who presents to maternity admissions reporting lower abdominal cramping. Patient reports she had a +upt at the end of July. Reports intermittent lower abdominal and back cramping that started yesterday. She rates pain 4/10. She has not taken or tried anything for pain relief. She also is reporting some external vaginal itching for the past 3 days, but denies vaginal discharge, odor, bleeding, or urinary s/s. LMP 06/13/2021  Pregnancy Course:   Past Medical History:  Diagnosis Date   Medical history non-contributory    OB History  Gravida Para Term Preterm AB Living  3 2 2  0 0 2  SAB IAB Ectopic Multiple Live Births  0 0 0 0 2    # Outcome Date GA Lbr Len/2nd Weight Sex Delivery Anes PTL Lv  3 Current           2 Term 07/04/17 [redacted]w[redacted]d 06:26 / 00:05 3365 g F Vag-Spont Local  LIV     Birth Comments: WNL  1 Term 10/14/13 [redacted]w[redacted]d 19:17 / 00:34 3185 g F Vag-Spont Local  LIV   Past Surgical History:  Procedure Laterality Date   NO PAST SURGERIES     No family history on file. Social History   Tobacco Use   Smoking status: Never   Smokeless tobacco: Never  Substance Use Topics   Alcohol use: No   Drug use: No   No Known Allergies No medications prior to admission.   I have reviewed patient's Past Medical Hx, Surgical Hx, Family Hx, Social Hx, medications and allergies.   ROS:  Review of Systems  Constitutional: Negative.   Respiratory: Negative.    Cardiovascular: Negative.   Gastrointestinal:  Positive for abdominal pain (cramping) and nausea. Negative for vomiting.  Genitourinary:  Negative for dysuria, frequency, vaginal bleeding and vaginal discharge.       Itching   Musculoskeletal:  Positive for back pain.  Neurological: Negative.    Physical Exam  Patient Vitals for the past 24 hrs:  BP Temp Temp src Pulse Resp SpO2  08/06/21 2007 129/68 98.2 F (36.8 C)  Oral 92 16 100 %  08/06/21 1827 120/72 98.5 F (36.9 C) Oral 95 17 100 %   Constitutional: well-developed, well-nourished female in no acute distress.  Cardiovascular: normal rate Respiratory: normal effort GI: abd soft, non-tender, obese MS: extremities nontender, no edema, normal ROM Neurologic: alert and oriented x 4.  GU: neg CVAT. Pelvic: NEFG, physiologic discharge, no blood, Nabothian cyst present 9 o'clock position of cervix, cervix visually closed, no CMT     Labs: Results for orders placed or performed during the hospital encounter of 08/06/21 (from the past 24 hour(s))  Wet prep, genital     Status: Abnormal   Collection Time: 08/06/21  6:05 PM   Specimen: Vaginal  Result Value Ref Range   Yeast Wet Prep HPF POC NONE SEEN NONE SEEN   Trich, Wet Prep NONE SEEN NONE SEEN   Clue Cells Wet Prep HPF POC NONE SEEN NONE SEEN   WBC, Wet Prep HPF POC MANY (A) NONE SEEN   Sperm NONE SEEN   CBC     Status: Abnormal   Collection Time: 08/06/21  6:25 PM  Result Value Ref Range   WBC 14.9 (H) 4.0 - 10.5 K/uL   RBC 4.30 3.87 - 5.11 MIL/uL   Hemoglobin 12.8 12.0 - 15.0 g/dL   HCT 38.0  36.0 - 46.0 %   MCV 88.4 80.0 - 100.0 fL   MCH 29.8 26.0 - 34.0 pg   MCHC 33.7 30.0 - 36.0 g/dL   RDW 78.9 38.1 - 01.7 %   Platelets 217 150 - 400 K/uL   nRBC 0.0 0.0 - 0.2 %  hCG, quantitative, pregnancy     Status: Abnormal   Collection Time: 08/06/21  6:25 PM  Result Value Ref Range   hCG, Beta Chain, Quant, S 101,966 (H) <5 mIU/mL  Urinalysis, Routine w reflex microscopic Urine, Clean Catch     Status: Abnormal   Collection Time: 08/06/21  6:34 PM  Result Value Ref Range   Color, Urine YELLOW YELLOW   APPearance HAZY (A) CLEAR   Specific Gravity, Urine 1.023 1.005 - 1.030   pH 6.0 5.0 - 8.0   Glucose, UA NEGATIVE NEGATIVE mg/dL   Hgb urine dipstick NEGATIVE NEGATIVE   Bilirubin Urine NEGATIVE NEGATIVE   Ketones, ur NEGATIVE NEGATIVE mg/dL   Protein, ur NEGATIVE NEGATIVE mg/dL    Nitrite NEGATIVE NEGATIVE   Leukocytes,Ua NEGATIVE NEGATIVE    Imaging:  US OB Comp Less 14 Wks  Result Date: 08/06/2021 CLINICAL DATA:  Abdominal pain. Quantitative beta HCG is pending. Uncertain LMP was 06/13/2021. EDC by LMP would be 03/20/2022. EXAM: OBSTETRIC <14 WK ULTRASOUND TECHNIQUE: Transabdominal ultrasound was performed for evaluation of the gestation as well as the maternal uterus and adnexal regions. COMPARISON:  None. FINDINGS: Intrauterine gestational sac: Single Yolk sac:  Visualized. Embryo:  Visualized. Cardiac Activity: Visualized. Heart Rate: 171 bpm CRL: 20.1 mm   8 w 4 d                  [name] EDC: 03/14/2022 Subchorionic hemorrhage:  None visualized. Maternal uterus/adnexae: Normal appearance of both ovaries. No free pelvic fluid. IMPRESSION: 1. Single living intrauterine embryo measuring 8 weeks 4 days. 2. By today's exam, EDC is 03/14/2022. Electronically Signed   By: Norva Pavlov M.D.   On: 08/06/2021 19:37    MAU Course: Orders Placed This Encounter  Procedures   Wet prep, genital   US OB Comp Less 14 Wks   Urinalysis, Routine w reflex microscopic   CBC   hCG, quantitative, pregnancy   Discharge patient   No orders of the defined types were placed in this encounter.   MDM: UA unremarkable CBC, HCG Wet prep unremarkable GC/CT collected [name] with results above. IUP at [redacted]w[redacted]d, FHR 171bpm. Spanish interpretor used during entire patient encounter  Assessment: 1. Normal IUP (intrauterine pregnancy) on prenatal ultrasound, first trimester   2. Abdominal pain affecting pregnancy   3. [redacted] weeks gestation of pregnancy   4. Language barrier     Plan: Discharge home in stable condition Strict return precautions given Return to MAU as needed Schedule an appointment as soon as possible to establish prenatal care    Follow-up Information     [facility] Follow up.   Specialty: Obstetrics and Gynecology Why:  call to schedule appointment to establish prenatal care Contact information: [address] [place] [address] [phone]                Allergies as of 08/06/2021   No Known Allergies      Medication List     TAKE these medications    multivitamin-prenatal 27-0.8 MG Tabs tablet Take 1 tablet by mouth daily at 12 noon.  Camelia Eng, MSN, CNM 08/06/2021 8:16 PM

## 2021-08-06 NOTE — MAU Note (Signed)
Pt also has had vaginal itching for the past 3 days

## 2021-08-06 NOTE — Discharge Instructions (Signed)

## 2021-08-06 NOTE — Progress Notes (Signed)
Chart reviewed for nurse visit. Agree with plan of care.   Korynn Kenedy Lorraine, CNM 08/06/2021 7:51 PM   

## 2021-08-06 NOTE — MAU Note (Signed)
Pt reports to mau with c/o nausea and lower abd cramping since yesterday. Denies vag bleeding.

## 2021-08-07 LAB — GC/CHLAMYDIA PROBE AMP (~~LOC~~) NOT AT ARMC
Chlamydia: NEGATIVE
Comment: NEGATIVE
Comment: NORMAL
Neisseria Gonorrhea: NEGATIVE

## 2021-08-22 ENCOUNTER — Telehealth (INDEPENDENT_AMBULATORY_CARE_PROVIDER_SITE_OTHER): Payer: Self-pay

## 2021-08-22 DIAGNOSIS — Z3A00 Weeks of gestation of pregnancy not specified: Secondary | ICD-10-CM

## 2021-08-22 DIAGNOSIS — O0990 Supervision of high risk pregnancy, unspecified, unspecified trimester: Secondary | ICD-10-CM | POA: Insufficient documentation

## 2021-08-22 DIAGNOSIS — O09299 Supervision of pregnancy with other poor reproductive or obstetric history, unspecified trimester: Secondary | ICD-10-CM

## 2021-08-22 NOTE — Progress Notes (Signed)
Agree with A & P. 

## 2021-08-22 NOTE — Progress Notes (Signed)
New OB Intake  I connected with  Elaine Green on 08/22/21 at 10:15 AM EDT by telephone Video Visit and verified that I am speaking with the correct person using two identifiers. Nurse is located at [facility] and pt is located at HOME.  I discussed the limitations, risks, security and privacy concerns of performing an evaluation and management service by telephone and the availability of in person appointments. I also discussed with the patient that there may be a patient responsible charge related to this service. The patient expressed understanding and agreed to proceed.  I explained I am completing New OB Intake today. We discussed her EDD of 03/14/22 that is based on U/S on 08/06/21. Pt is G3/P2. I reviewed her allergies, medications, Medical/Surgical/OB history, and appropriate screenings. I informed her of [facility] services. Based on history, this is a/an  pregnancy complicated by Hx of PROM  .   Patient Active Problem List   Diagnosis Date Noted   PROM (premature rupture of membranes) 07/04/2017   NSVD (normal spontaneous vaginal delivery) 07/04/2017   Contraception management 10/23/2013    Concerns addressed today  Delivery Plans:  Plans to deliver at [facility].   MyChart/Babyscripts MyChart access verified. I explained pt will have some visits in office and some virtually. Babyscripts instructions given and order placed. Patient verifies receipt of registration text/e-mail. Account successfully created and app downloaded.  Blood Pressure Cuff  Patient is self-pay; explained patient will be given BP cuff at first prenatal appt. Explained after first prenatal appt pt will check weekly and document in Babyscripts.  Weight scale: Patient    have weight scale. Weight scale ordered for patient to pick up form [facility].   Anatomy US Explained first scheduled [name] will be around 19 weeks. Anatomy US scheduled for 10/18/21 at 1:45. Pt notified to arrive at  1:30.  Labs Discussed Avelina Laine genetic screening with patient. Would like both Panorama and Horizon drawn at new OB visit. Routine prenatal labs needed.  Covid Vaccine Patient has not covid vaccine.   Mother/ Baby Dyad Candidate?    If yes, offer as possibility  Informed patient of [facility] website  and placed link in her AVS.   Social Determinants of Health Food Insecurity: Patient denies food insecurity. WIC Referral: Patient is interested in referral to [facility].  Transportation: Patient denies transportation needs. Childcare: Discussed no children allowed at ultrasound appointments. Offered childcare services; patient declines childcare services at this time.  Send link to Pregnancy Navigators   Placed OB Box on problem list and updated  First visit review I reviewed new OB appt with pt. I explained she will have a pelvic exam, ob bloodwork with genetic screening, and PAP smear. Explained pt will be seen by Dr. Alysia Penna at first visit; encounter routed to appropriate provider. Explained that patient will be seen by pregnancy navigator following visit with provider. [facility] information placed in AVS.   Henrietta Dine, CMA 08/22/2021  10:48 AM

## 2021-08-30 ENCOUNTER — Encounter: Payer: Self-pay | Admitting: Obstetrics and Gynecology

## 2021-08-30 ENCOUNTER — Ambulatory Visit (INDEPENDENT_AMBULATORY_CARE_PROVIDER_SITE_OTHER): Payer: Self-pay | Admitting: Obstetrics and Gynecology

## 2021-08-30 ENCOUNTER — Other Ambulatory Visit (HOSPITAL_COMMUNITY)
Admission: RE | Admit: 2021-08-30 | Discharge: 2021-08-30 | Disposition: A | Discharge status: Home / Self Care | Payer: Self-pay | Source: Ambulatory Visit | Attending: Obstetrics and Gynecology | Admitting: Obstetrics and Gynecology

## 2021-08-30 ENCOUNTER — Other Ambulatory Visit: Payer: Self-pay

## 2021-08-30 DIAGNOSIS — O0990 Supervision of high risk pregnancy, unspecified, unspecified trimester: Secondary | ICD-10-CM | POA: Insufficient documentation

## 2021-08-30 DIAGNOSIS — Z23 Encounter for immunization: Secondary | ICD-10-CM

## 2021-08-30 NOTE — Progress Notes (Signed)
Subjective:  Elaine Green is a 26 y.o. G3P2002 at [redacted]w[redacted]d being seen today for her first OB visit. EDD by first trimester U/S. H/O TSVD x 2 without problems. No chronic medical problems of medications.  She is currently monitored for the following issues for this low-risk pregnancy and has Supervision of high risk pregnancy, antepartum on their problem list.  Patient reports no complaints.  Contractions: Not present. Vag. Bleeding: None.   . Denies leaking of fluid.   The following portions of the patient's history were reviewed and updated as appropriate: allergies, current medications, past family history, past medical history, past social history, past surgical history and problem list. Problem list updated.  Objective:   Vitals:   08/30/21 1528  BP: 112/72  Pulse: 92  Weight: 246 lb 6.4 oz (111.8 kg)    Fetal Status: Fetal Heart Rate (bpm): 163         General:  Alert, oriented and cooperative. Patient is in no acute distress.  Skin: Skin is warm and dry. No rash noted.   Cardiovascular: Normal heart rate noted  Respiratory: Normal respiratory effort, no problems with respiration noted  Abdomen: Soft, gravid, appropriate for gestational age. Pain/Pressure: Absent     Pelvic:  Cervical exam deferred        Extremities: Normal range of motion.  Edema: None  Mental Status: Normal mood and affect. Normal behavior. Normal judgment and thought content.   Urinalysis:      Assessment and Plan:  Pregnancy: G3P2002 at [redacted]w[redacted]d  1. Supervision of high risk pregnancy, antepartum Prenatal care and labs reviewed with pt Self pay Referred to [facility] for pap smear Flu vaccine today  Preterm labor symptoms and general obstetric precautions including but not limited to vaginal bleeding, contractions, leaking of fluid and fetal movement were reviewed in detail with the patient. Please refer to After Visit Summary for other counseling recommendations.  Return in about 4 weeks (around  09/27/2021) for OB visit, face to face, any provider.   Hermina Staggers, MD

## 2021-08-30 NOTE — Patient Instructions (Signed)
Primer trimestre de embarazo °First Trimester of Pregnancy °El primer trimestre de embarazo comienza el primer día de su último periodo menstrual hasta el final de la semana 12. También se dice que va desde el mes 1 hasta el mes 3 de embarazo. °Durante el primer trimestre, ocurren cambios en el cuerpo °Su organismo atraviesa por muchos cambios durante el embarazo. En general, los cambios vuelven a la normalidad después del nacimiento del bebé. °Cambios físicos °Usted puede aumentar o bajar de peso. °Las mamas pueden agrandarse y doler. La zona que rodea los pezones puede oscurecerse. °Pueden aparecer zonas oscuras o manchas en el rostro. °Tal vez haya cambios en el cabello. °Cambios en la salud °Es posible que se sienta como si fuera a vomitar (náuseas) y quizás vomite. °Es posible que tenga acidez estomacal. °Es posible que tenga dolores de cabeza. °Es posible que tenga dificultades para defecar (estreñimiento). °Las encías pueden sangrarle. °Otros cambios °Es posible que se canse con facilidad. °Es posible que haga pis (orine) con mayor frecuencia. °Los períodos menstruales se interrumpirán. °Es posible que no tenga hambre. °Es posible que quiera comer ciertos tipos de alimentos. °Puede tener cambios en sus emociones de un día para otro. °Es posible que tenga más sueños. °Siga estas instrucciones en su casa: °Medicamentos °Use los medicamentos de venta libre y los recetados solamente como se lo haya indicado el médico. Algunos medicamentos no son seguros durante el embarazo. °Tome vitaminas prenatales que contengan por lo menos 600 microgramos (mcg) de ácido fólico. °Comida y bebida °Consuma comidas saludables que incluyan lo siguiente: °Frutas y verduras frescas. °Cereales integrales. °Buenas fuentes de proteínas, como carne, huevos y tofu. °Productos lácteos con bajo contenido de grasa. °Evite la carne cruda y el jugo, la leche y el queso sin pasteurizar. °Si se siente como si fuera a vomitar: °Ingiera 4 o 5  comidas pequeñas por día en lugar de 3 abundantes. °Intente comer algunas galletitas saladas. °Beba líquidos entre las comidas, en lugar de hacerlo durante estas. °Es posible que deba tomar medidas para prevenir o tratar los problemas para defecar: °Beber suficiente líquido para mantener el pis (orina) de color amarillo pálido. °Come alimentos ricos en fibra. Entre ellos, frijoles, cereales integrales y frutas y verduras frescas. °Limitar los alimentos con alto contenido de grasa y azúcar. Estos incluyen alimentos fritos o dulces. °Actividad °Haga ejercicios solamente como se lo haya indicado el médico. La mayoría de las personas pueden realizar su rutina de ejercicios habitual durante el embarazo. °Deje de hacer ejercicios si tiene cólicos o dolor en la parte baja del vientre (abdomen) o en la cintura. °No haga ejercicio si hace demasiado calor, hay demasiada humedad o se encuentra en un lugar de mucha altura (altitud elevada). °Evite levantar pesos excesivos. °Si lo desea, puede continuar teniendo relaciones sexuales, a menos que el médico le indique lo contrario. °Alivio del dolor y del malestar °Use un sostén que le brinde buen soporte si le duelen las mamas. °Descanse con las piernas levantadas (elevadas) si tiene calambres en las piernas o dolor en la parte baja de la espalda. °Si tiene las venas de las piernas abultadas (venas varicosas): °Use medias de compresión según las indicaciones de su médico. °Levante los pies durante 15 minutos, 3 o 4 veces por día. °Limite la sal en sus alimentos. °Seguridad °Use el cinturón de seguridad en todo momento mientras vaya en auto. °Hable con el médico si alguien le está haciendo daño o gritando mucho. °Hable con el médico si se siente triste o   tiene pensamientos acerca de hacerse daño a usted misma. °Estilo de vida °No se dé baños de inmersión en agua caliente, baños turcos ni saunas. °No se haga duchas vaginales. No use tampones ni toallas higiénicas perfumadas. °No  consuma medicamentos a base de hierbas, drogas ilegales, ni medicamentos que el médico no haya autorizado. No beba alcohol. °No fume ni consuma ningún producto que contenga nicotina o tabaco. Si necesita ayuda para dejar de fumar, consulte al médico. °Evite el contacto con las bandejas sanitarias de los gatos y la tierra que estos animales usan. Estos contienen gérmenes que pueden dañar al bebé y causar la pérdida del bebé ya sea aborto espontáneo o muerte fetal. °Instrucciones generales °Cumpla con todas las visitas de seguimiento. Esto es importante. °Pida ayuda si necesita asesoramiento o asistencia con la alimentación. El médico puede aconsejarla o indicarle dónde recurrir para recibir ayuda. °Visite al dentista. En su casa, lávese los dientes con un cepillo dental suave. Pásese el hilo dental suavemente. °Escriba sus preguntas. Llévelas cuando concurra a las visitas prenatales. °Dónde buscar más información °American Pregnancy Association (Asociación Americana del Embarazo): americanpregnancy.org °American College of Obstetricians and Gynecologists (Colegio Estadounidense de Obstetras y Ginecólogos): www.acog.org °Office on Women's Health (Oficina para la Salud de la Mujer): womenshealth.gov/pregnancy °Comuníquese con un médico si: °Tiene mareos. °Tiene fiebre. °Tiene cólicos leves o siente presión en la parte baja del vientre. °Sufre un dolor persistente en el abdomen. °Sigue sintiendo como si fuera a vomitar, vomita o hace deposiciones acuosas (diarrea) durante 24 horas o más. °Advierte líquido con mal olor que proviene de la vagina. °Siente dolor al orinar. °Se expone a una enfermedad que se transmite de una persona a otra, como varicela, sarampión, virus de Zika, VIH o hepatitis. °Solicite ayuda de inmediato si: °Tiene sangrado o pequeñas pérdidas vaginales. °Tiene cólicos o dolor muy intensos en el vientre. °Le falta el aire o le duele el pecho. °Sufre cualquier tipo de lesión, por ejemplo, debido a una  caída o un accidente automovilístico. °Tiene dolor, hinchazón o enrojecimiento nuevos en un brazo o una pierna o se produce un aumento de alguno de estos síntomas. °Resumen °El primer trimestre del embarazo comienza el primer día de su último periodo menstrual hasta el final de la semana 12 (meses 1 al 3). °Ingiera 4 o 5 comidas pequeñas por día en lugar de 3 abundantes. °No fume ni consuma ningún producto que contenga nicotina o tabaco. Si necesita ayuda para dejar de fumar, consulte al médico. °Cumpla con todas las visitas de seguimiento. °Esta información no tiene como fin reemplazar el consejo del médico. Asegúrese de hacerle al médico cualquier pregunta que tenga. °Document Revised: 06/24/2020 Document Reviewed: 06/24/2020 °Elsevier Patient Education © 2022 Elsevier Inc. ° °

## 2021-08-31 ENCOUNTER — Encounter: Payer: Self-pay | Admitting: Obstetrics and Gynecology

## 2021-08-31 DIAGNOSIS — Z2839 Other underimmunization status: Secondary | ICD-10-CM | POA: Insufficient documentation

## 2021-08-31 LAB — CBC/D/PLT+RPR+RH+ABO+RUBIGG...
Antibody Screen: NEGATIVE
Basophils Absolute: 0 10*3/uL (ref 0.0–0.2)
Basos: 0 %
EOS (ABSOLUTE): 0.2 10*3/uL (ref 0.0–0.4)
Eos: 2 %
HCV Ab: <0.1 s/co ratio (ref 0.0–0.9)
HIV Screen 4th Generation wRfx: NONREACTIVE
Hematocrit: 36.5 % (ref 34.0–46.6)
Hemoglobin: 12.3 g/dL (ref 11.1–15.9)
Hepatitis B Surface Ag: NEGATIVE
Immature Grans (Abs): 0 10*3/uL (ref 0.0–0.1)
Immature Granulocytes: 0 %
Lymphocytes Absolute: 3.4 10*3/uL — ABNORMAL HIGH (ref 0.7–3.1)
Lymphs: 34 %
MCH: 29.2 pg (ref 26.6–33.0)
MCHC: 33.7 g/dL (ref 31.5–35.7)
MCV: 87 fL (ref 79–97)
Monocytes Absolute: 0.6 10*3/uL (ref 0.1–0.9)
Monocytes: 6 %
Neutrophils Absolute: 5.8 10*3/uL (ref 1.4–7.0)
Neutrophils: 58 %
Platelets: 196 10*3/uL (ref 150–450)
RBC: 4.21 x10E6/uL (ref 3.77–5.28)
RDW: 13.1 % (ref 11.7–15.4)
RPR Ser Ql: NONREACTIVE
Rh Factor: POSITIVE
Rubella Antibodies, IGG: <0.9 index — ABNORMAL LOW (ref >0.99)
WBC: 10.1 10*3/uL (ref 3.4–10.8)

## 2021-08-31 LAB — GC/CHLAMYDIA PROBE AMP (~~LOC~~) NOT AT ARMC
Chlamydia: NEGATIVE
Comment: NEGATIVE
Comment: NORMAL
Neisseria Gonorrhea: NEGATIVE

## 2021-08-31 LAB — HEMOGLOBIN A1C
Est. average glucose Bld gHb Est-mCnc: 123 mg/dL
Hgb A1c MFr Bld: 5.9 % — ABNORMAL HIGH (ref 4.8–5.6)

## 2021-08-31 LAB — HCV INTERPRETATION

## 2021-09-01 LAB — URINE CULTURE, OB REFLEX

## 2021-09-01 LAB — CULTURE, OB URINE

## 2021-09-27 ENCOUNTER — Encounter: Payer: Self-pay | Admitting: Family Medicine

## 2021-10-18 ENCOUNTER — Other Ambulatory Visit: Payer: Self-pay

## 2021-10-18 ENCOUNTER — Ambulatory Visit: Payer: Self-pay | Attending: Obstetrics and Gynecology

## 2021-10-18 ENCOUNTER — Ambulatory Visit: Payer: Self-pay | Admitting: *Deleted

## 2021-10-18 ENCOUNTER — Encounter: Payer: Self-pay | Admitting: *Deleted

## 2021-10-18 VITALS — BP 109/65 | HR 90

## 2021-10-18 DIAGNOSIS — O0990 Supervision of high risk pregnancy, unspecified, unspecified trimester: Secondary | ICD-10-CM | POA: Insufficient documentation

## 2021-10-24 ENCOUNTER — Ambulatory Visit (INDEPENDENT_AMBULATORY_CARE_PROVIDER_SITE_OTHER): Payer: Self-pay | Admitting: Family Medicine

## 2021-10-24 ENCOUNTER — Other Ambulatory Visit: Payer: Self-pay

## 2021-10-24 VITALS — BP 110/74 | HR 105

## 2021-10-24 DIAGNOSIS — O0990 Supervision of high risk pregnancy, unspecified, unspecified trimester: Secondary | ICD-10-CM

## 2021-10-24 NOTE — Progress Notes (Signed)
Patient had to reschedule her appointment due to provider being sick.  Patient agreed to get her vitals and fetal heart tone.   [employer] Language interpreter assist with the visit.    BP 110/74 Pulse: 105

## 2021-10-25 NOTE — Progress Notes (Signed)
Patient was evaluated by nursing staff. Agree with assessment and plan.  °

## 2021-10-27 ENCOUNTER — Telehealth: Payer: Self-pay | Admitting: Genetics

## 2021-10-27 NOTE — Telephone Encounter (Signed)
Elaine Green was contacted by telephone with a Spanish interpreter to review their noninvasive prenatal screening (NIPS) result. The result is low risk, consistent with a female fetus. This screening significantly reduces the risk that the current pregnancy has Down syndrome, Trisomy 21, Trisomy 13, Monosomy X, and Triploidy. [name] understands that this is a screening and not a diagnostic test. All questions answered.

## 2021-11-01 ENCOUNTER — Other Ambulatory Visit: Payer: Self-pay

## 2021-11-09 ENCOUNTER — Ambulatory Visit (INDEPENDENT_AMBULATORY_CARE_PROVIDER_SITE_OTHER): Payer: Self-pay | Admitting: Obstetrics & Gynecology

## 2021-11-09 ENCOUNTER — Other Ambulatory Visit: Payer: Self-pay

## 2021-11-09 VITALS — BP 105/70 | HR 90 | Wt 239.3 lb

## 2021-11-09 DIAGNOSIS — Z3A22 22 weeks gestation of pregnancy: Secondary | ICD-10-CM

## 2021-11-09 DIAGNOSIS — O0990 Supervision of high risk pregnancy, unspecified, unspecified trimester: Secondary | ICD-10-CM

## 2021-11-09 DIAGNOSIS — O99210 Obesity complicating pregnancy, unspecified trimester: Secondary | ICD-10-CM

## 2021-11-09 NOTE — Patient Instructions (Signed)
Return to office for any scheduled appointments. Call the office or go to the MAU at [facility] if:  You begin to have strong, frequent contractions  Your water breaks.  Sometimes it is a big gush of fluid, sometimes it is just a trickle that keeps getting your panties wet or running down your legs  You have vaginal bleeding.  It is normal to have a small amount of spotting if your cervix was checked.   You do not feel your baby moving like normal.  If you do not, get something to eat and drink and lay down and focus on feeling your baby move.   If your baby is still not moving like normal, you should call the office or go to MAU.  Any other obstetric concerns.   

## 2021-11-09 NOTE — Progress Notes (Signed)
PRENATAL VISIT NOTE  Subjective:  Elaine Green is a 26 y.o. G3P2002 at [redacted]w[redacted]d being seen today for ongoing prenatal care. Patient is Spanish-speaking only, interpreter present for this encounter.  She is currently monitored for the following issues for this high-risk pregnancy and has Supervision of high risk pregnancy, antepartum; Rubella non-immune status, antepartum; and Maternal morbid obesity, antepartum ([place]) on their problem list.  Patient reports no complaints.  Contractions: Irritability. Vag. Bleeding: None.  Movement: Present. Denies leaking of fluid.   The following portions of the patient's history were reviewed and updated as appropriate: allergies, current medications, past family history, past medical history, past social history, past surgical history and problem list.   Objective:   Vitals:   11/09/21 1022  BP: 105/70  Pulse: 90  Weight: 239 lb 4.8 oz (108.5 kg)    Fetal Status: Fetal Heart Rate (bpm): 149   Movement: Present     General:  Alert, oriented and cooperative. Patient is in no acute distress.  Skin: Skin is warm and dry. No rash noted.   Cardiovascular: Normal heart rate noted  Respiratory: Normal respiratory effort, no problems with respiration noted  Abdomen: Soft, gravid, appropriate for gestational age.  Pain/Pressure: Present     Pelvic: Cervical exam deferred        Extremities: Normal range of motion.  Edema: None  Mental Status: Normal mood and affect. Normal behavior. Normal judgment and thought content.   [name] MFM OB DETAIL +14 WK  Result Date: 10/18/2021 ----------------------------------------------------------------------  OBSTETRICS REPORT                       (Signed Final 10/18/2021 03:16 pm) ---------------------------------------------------------------------- Patient Info  ID #:       [id number]                          D.O.B.:  [birth year] (26 yrs)  Name:       Elaine Green             Visit Date: 10/18/2021 01:26  pm              MARTINEZ ---------------------------------------------------------------------- Performed By  Attending:        Sander Nephew      Ref. Address:     [place], [place]  [address]  Performed By:     Maryfrances Bunnell      [facility]                                                             [facility]                                                             [facility]  Referred By:      Chancy Milroy                    MD ---------------------------------------------------------------------- Orders  #  Description                           Code        Ordered By  1  [name] MFM OB DETAIL +14 [place]               [id number]    Arlina Robes ----------------------------------------------------------------------  #  Order #                     Accession #                Episode #  1  [id number]                   [id number]                 [id number] ---------------------------------------------------------------------- Indications  [redacted] weeks gestation of pregnancy                [id number]  Obesity complicating pregnancy, second         O99.212  trimester ---------------------------------------------------------------------- Fetal Evaluation  Num Of Fetuses:         1  Fetal Heart Rate(bpm):  138  Cardiac Activity:       Observed  Presentation:           Cephalic  Placenta:               Posterior  P. Cord Insertion:      Visualized  Amniotic Fluid  AFI FV:      Within normal limits                              Largest Pocket(cm)                              5.15 ---------------------------------------------------------------------- Biometry  BPD:      42.6  mm  G. Age:  18w 6d         46  %    CI:         68.58   %    70 - 86                                                          FL/HC:      18.0   %    16.1 - 18.3  HC:      164.4  mm     G. Age:  19w 1d         51  %    HC/AC:      1.11        1.09 - 1.39  AC:      148.1  mm     G. Age:  20w 1d         80  %    FL/BPD:     69.5   %  FL:       29.6  mm     G. Age:  19w 1d         48  %    FL/AC:      20.0   %    20 - 24  HUM:      30.5  mm     G. Age:  20w 1d         78  %  CER:        20  mm     G. Age:  19w 2d         50  %  NFT:       4.5  mm  LV:        5.5  mm  CM:        6.3  mm  Est. FW:     300  gm    0 lb 11 oz      79  % ---------------------------------------------------------------------- OB History  Gravidity:    3         Term:   2 ---------------------------------------------------------------------- Gestational Age  Clinical EDD:  19w 0d                                        EDD:   03/14/22  U/S Today:     19w 2d                                        EDD:   03/12/22  Best:          19w 0d     Det. By:  Clinical EDD             EDD:   03/14/22 ---------------------------------------------------------------------- Anatomy  Cranium:               Appears normal         LVOT:                   Appears normal  Cavum:  Appears normal         Aortic Arch:            Appears normal  Ventricles:            Appears normal         Ductal Arch:            Appears normal  Choroid Plexus:        Appears normal         Diaphragm:              Appears normal  Cerebellum:            Appears normal         Stomach:                Appears normal, left                                                                        sided  Posterior Fossa:       Appears normal         Abdomen:                Appears normal  Nuchal Fold:           Appears normal         Abdominal Wall:         Appears nml (cord                                                                        insert, abd wall)  Face:                  Appears normal         Cord Vessels:            Appears normal (3                         (orbits and profile)                           vessel cord)  Lips:                  Appears normal         Kidneys:                Appear normal  Palate:                Appears normal         Bladder:                Appears normal  Thoracic:              Appears normal         Spine:                  Appears normal  Heart:  Appears normal         Upper Extremities:      Appears normal                         (4CH, axis, and                         situs)  RVOT:                  Appears normal         Lower Extremities:      Appears normal  Other:  Fetus appears to be a female. Heels/feet, open hands/5th digits, nasal          bone, lenses, VC, 3VV, 3VT visualized. ---------------------------------------------------------------------- Cervix Uterus Adnexa  Cervix  Length:           4.36  cm.  Normal appearance by transabdominal scan.  Uterus  No abnormality visualized.  Right Ovary  Within normal limits.  Left Ovary  Within normal limits.  Adnexa  No adnexal mass visualized. ---------------------------------------------------------------------- Impression  Single intrauterine pregnancy here for a detailed anatomy  Normal anatomy with measurements consistent with dates  There is good fetal movement and amniotic fluid volume  No genetic testing performed however, she opted to have the  panorama drawn today. ---------------------------------------------------------------------- Recommendations  Follow up a clinically indicated. ----------------------------------------------------------------------               Sander Nephew, MD Electronically Signed Final Report   10/18/2021 03:16 pm ----------------------------------------------------------------------    Assessment and Plan:  Pregnancy: G3P2002 at [redacted]w[redacted]d 1. Maternal morbid obesity, antepartum ([facility]) TWG -9 lb, doing well so far  2. [redacted] weeks gestation of pregnancy 3. Supervision of high risk  pregnancy, antepartum Normal anatomy scan. Preterm labor symptoms and general obstetric precautions including but not limited to vaginal bleeding, contractions, leaking of fluid and fetal movement were reviewed in detail with the patient. Please refer to After Visit Summary for other counseling recommendations.   Return in about 4 weeks (around 12/07/2021) for OFFICE OB VISIT (MD or APP).  No future appointments.  Verita Schneiders, MD

## 2021-12-07 ENCOUNTER — Ambulatory Visit (INDEPENDENT_AMBULATORY_CARE_PROVIDER_SITE_OTHER): Payer: Self-pay | Admitting: Obstetrics & Gynecology

## 2021-12-07 ENCOUNTER — Other Ambulatory Visit: Payer: Self-pay

## 2021-12-07 VITALS — BP 109/70 | HR 96 | Wt 240.2 lb

## 2021-12-07 DIAGNOSIS — O0990 Supervision of high risk pregnancy, unspecified, unspecified trimester: Secondary | ICD-10-CM

## 2021-12-07 DIAGNOSIS — O09899 Supervision of other high risk pregnancies, unspecified trimester: Secondary | ICD-10-CM

## 2021-12-07 DIAGNOSIS — Z2839 Other underimmunization status: Secondary | ICD-10-CM

## 2021-12-07 DIAGNOSIS — O99210 Obesity complicating pregnancy, unspecified trimester: Secondary | ICD-10-CM

## 2021-12-07 NOTE — Progress Notes (Signed)
   PRENATAL VISIT NOTE  Subjective:  Elaine Green is a 26 y.o. G3P2002 at [redacted]w[redacted]d being seen today for ongoing prenatal care.  She is currently monitored for the following issues for this high-risk pregnancy and has Supervision of high risk pregnancy, antepartum; Rubella non-immune status, antepartum; and Maternal morbid obesity, antepartum ([facility]) on their problem list.  Patient reports no complaints.  Contractions: Not present. Vag. Bleeding: None.  Movement: Present. Denies leaking of fluid.   The following portions of the patient's history were reviewed and updated as appropriate: allergies, current medications, past family history, past medical history, past social history, past surgical history and problem list.   Objective:   Vitals:   12/07/21 1054  BP: 109/70  Pulse: 96  Weight: 240 lb 3.2 oz (109 kg)    Fetal Status: Fetal Heart Rate (bpm): 137 Fundal Height: 26 cm Movement: Present     General:  Alert, oriented and cooperative. Patient is in no acute distress.  Skin: Skin is warm and dry. No rash noted.   Cardiovascular: Normal heart rate noted  Respiratory: Normal respiratory effort, no problems with respiration noted  Abdomen: Soft, gravid, appropriate for gestational age.  Pain/Pressure: Absent     Pelvic: Cervical exam deferred        Extremities: Normal range of motion.  Edema: None  Mental Status: Normal mood and affect. Normal behavior. Normal judgment and thought content.   Assessment and Plan:  Pregnancy: G3P2002 at [redacted]w[redacted]d 1. Supervision of high risk pregnancy, antepartum Body mass index is 43.93 kg/m.  - [name] MFM OB FOLLOW UP; Future  2. Rubella non-immune status, antepartum Vaccinate PP  3. Maternal morbid obesity, antepartum ([facility]CC) [name] f/u  Preterm labor symptoms and general obstetric precautions including but not limited to vaginal bleeding, contractions, leaking of fluid and fetal movement were reviewed in detail with the patient. Please  refer to After Visit Summary for other counseling recommendations.   Return in about 2 weeks (around 12/21/2021).  No future appointments.  Scheryl Darter, MD

## 2021-12-07 NOTE — Progress Notes (Signed)
No questions or concerns today 

## 2021-12-14 ENCOUNTER — Other Ambulatory Visit: Payer: Self-pay | Admitting: *Deleted

## 2021-12-14 DIAGNOSIS — O0990 Supervision of high risk pregnancy, unspecified, unspecified trimester: Secondary | ICD-10-CM

## 2021-12-20 ENCOUNTER — Encounter: Payer: Self-pay | Admitting: Family Medicine

## 2021-12-20 ENCOUNTER — Other Ambulatory Visit: Payer: Self-pay

## 2021-12-20 ENCOUNTER — Ambulatory Visit (INDEPENDENT_AMBULATORY_CARE_PROVIDER_SITE_OTHER): Payer: Self-pay | Admitting: Family Medicine

## 2021-12-20 VITALS — BP 103/64 | HR 92 | Wt 242.2 lb

## 2021-12-20 DIAGNOSIS — Z2839 Other underimmunization status: Secondary | ICD-10-CM

## 2021-12-20 DIAGNOSIS — O99210 Obesity complicating pregnancy, unspecified trimester: Secondary | ICD-10-CM

## 2021-12-20 DIAGNOSIS — O0990 Supervision of high risk pregnancy, unspecified, unspecified trimester: Secondary | ICD-10-CM

## 2021-12-20 DIAGNOSIS — Z3A28 28 weeks gestation of pregnancy: Secondary | ICD-10-CM

## 2021-12-20 DIAGNOSIS — O09899 Supervision of other high risk pregnancies, unspecified trimester: Secondary | ICD-10-CM

## 2021-12-20 NOTE — Progress Notes (Signed)
Follow up ultrasound scheduled for 01/15/22 at 8 AM . Patient notified.

## 2021-12-20 NOTE — Progress Notes (Addendum)
° ° °  Subjective:  Elaine Green is a 26 y.o. G3P2002 at [redacted]w[redacted]d being seen today for ongoing prenatal care.  She is currently monitored for the following issues for this high-risk pregnancy and has Supervision of high risk pregnancy, antepartum; Rubella non-immune status, antepartum; and Maternal morbid obesity, antepartum ([place]) on their problem list.  Patient reports no complaints.  Contractions: Not present. Vag. Bleeding: None.  Movement: Present. Denies leaking of fluid.   In person spanish interpreter present.   The following portions of the patient's history were reviewed and updated as appropriate: allergies, current medications, past family history, past medical history, past social history, past surgical history and problem list.   Objective:   Vitals:   12/20/21 0840  BP: 103/64  Pulse: 92  Weight: 242 lb 3.2 oz (109.9 kg)    Fetal Status: Fetal Heart Rate (bpm): 145 Fundal Height: 29 cm Movement: Present     General:  Alert, oriented and cooperative. Patient is in no acute distress.  Skin: Skin is warm and dry. No rash noted.   Cardiovascular: Normal heart rate noted  Respiratory: Normal respiratory effort, no problems with respiration noted  Abdomen: Soft, gravid, appropriate for gestational age. Pain/Pressure: Absent     Pelvic:  Cervical exam deferred        Extremities: Normal range of motion.  Edema: None  Mental Status: Normal mood and affect. Normal behavior. Normal judgment and thought content.    Assessment and Plan:  Pregnancy: G3P2002 at [redacted]w[redacted]d  1. Supervision of high risk pregnancy, antepartum Doing well.   2. [redacted] weeks gestation of pregnancy Third trimester labs collected with gtt. Letter given to have Tdap at HD.   3. Maternal morbid obesity, antepartum ([place]) Growth [name] scheduled for 1/16.   4. Rubella non-immune status, antepartum Offer MMR pp.   Preterm labor symptoms and general obstetric precautions including but not limited to  vaginal bleeding, contractions, leaking of fluid and fetal movement were reviewed in detail with the patient. Please refer to After Visit Summary for other counseling recommendations.   Return in about 2 weeks (around 01/03/2022) for [place].   Patriciaann Clan, DO

## 2021-12-21 LAB — CBC
Hematocrit: 32.9 % — ABNORMAL LOW (ref 34.0–46.6)
Hemoglobin: 11.1 g/dL (ref 11.1–15.9)
MCH: 29.8 pg (ref 26.6–33.0)
MCHC: 33.7 g/dL (ref 31.5–35.7)
MCV: 88 fL (ref 79–97)
Platelets: 188 10*3/uL (ref 150–450)
RBC: 3.72 x10E6/uL — ABNORMAL LOW (ref 3.77–5.28)
RDW: 12.4 % (ref 11.7–15.4)
WBC: 9.7 10*3/uL (ref 3.4–10.8)

## 2021-12-21 LAB — GLUCOSE TOLERANCE, 2 HOURS W/ 1HR
Glucose, 1 hour: 150 mg/dL (ref 70–179)
Glucose, 2 hour: 96 mg/dL (ref 70–152)
Glucose, Fasting: 96 mg/dL — ABNORMAL HIGH (ref 70–91)

## 2021-12-21 LAB — RPR: RPR Ser Ql: NONREACTIVE

## 2021-12-21 LAB — HIV ANTIBODY (ROUTINE TESTING W REFLEX): HIV Screen 4th Generation wRfx: NONREACTIVE

## 2021-12-26 ENCOUNTER — Telehealth: Payer: Self-pay

## 2021-12-26 NOTE — Telephone Encounter (Addendum)
-----   Message from Allayne Stack, DO sent at 12/22/2021  3:04 PM EST ----- Called patient with spanish interpreter. Discussed abnormal 2 hour gtt and new gestational DM. Confirmed she last ate the night before and had been fasting. Reminder of labs reassuring. She is aware that she will need to start checking her CBGs. Growth [name] already for 1/16.   Can we please send DM supplies and referral to our DM coordinator?   Thank you, Dr Sol Blazing pt with [facility] [phone] and informed pt her results and that she has an appt with diabetes education on 01/04/22 at 1415 in which she will get her diabetes supplies because she is self pay.  Pt verbalized understanding with no further questions.   Leonette Nutting  12/26/21

## 2021-12-28 ENCOUNTER — Other Ambulatory Visit: Payer: Self-pay | Admitting: *Deleted

## 2021-12-28 DIAGNOSIS — O24419 Gestational diabetes mellitus in pregnancy, unspecified control: Secondary | ICD-10-CM | POA: Insufficient documentation

## 2021-12-28 NOTE — Progress Notes (Signed)
referr

## 2021-12-31 NOTE — L&D Delivery Note (Signed)
OB/GYN Faculty Practice Delivery Note ? ?Elaine Green is a 27 y.o. [phone] s/p VD at [redacted]w[redacted]d. She was admitted for IOL due A2GDM.  ? ?ROM: 2h 53m with clear fluid ?GBS Status:  Negative/-- (02/20 1150) ?Maximum Maternal Temperature: 98.53F ? ?Labor Progress: ?Initial SVE: 0/thick. She then progressed to complete with assistance of cytotec, pit, and AROM.  ? ?Delivery Date/Time: 3/1 at 0349  ?Delivery: Called to room and patient was complete and pushing. Head delivered R OA. Loose nuchal cord present and delivered through. Shoulder and body delivered in usual fashion. Infant with spontaneous cry, placed on mother's abdomen, dried and stimulated. Cord clamped x 2 after 1-minute delay, and cut by FOB. Cord blood drawn. Placenta delivered spontaneously with gentle cord traction. Fundus firm with massage and Pitocin. Labia, perineum, vagina, and cervix inspected with a small first degree perineal and hemostatic peri-urethral abrasion.  ?Baby Weight: pending ? ?Placenta: 3 vessel, intact. Sent to L&D ?Complications: None ?Lacerations: 1st degree (repaired with 3.0 vicryl) and right peri-urethral abrasion (not repaired) ?EBL: 200 mL ?Analgesia: local lidocaine  ? ?Infant:  ?APGAR (1 MIN): 8   ?APGAR (5 MINS): 9 ? ?Darrelyn Hillock, DO  ?[facility] Fellow, Faculty Practice ?[facility] ?02/28/2022, 4:52 AM ? ? ? ?  ?

## 2022-01-03 ENCOUNTER — Ambulatory Visit (INDEPENDENT_AMBULATORY_CARE_PROVIDER_SITE_OTHER): Payer: Self-pay | Admitting: Obstetrics and Gynecology

## 2022-01-03 ENCOUNTER — Other Ambulatory Visit: Payer: Self-pay

## 2022-01-03 VITALS — BP 102/83 | HR 97 | Wt 239.3 lb

## 2022-01-03 DIAGNOSIS — O24410 Gestational diabetes mellitus in pregnancy, diet controlled: Secondary | ICD-10-CM

## 2022-01-03 DIAGNOSIS — O99210 Obesity complicating pregnancy, unspecified trimester: Secondary | ICD-10-CM

## 2022-01-03 DIAGNOSIS — Z2839 Other underimmunization status: Secondary | ICD-10-CM

## 2022-01-03 DIAGNOSIS — O09899 Supervision of other high risk pregnancies, unspecified trimester: Secondary | ICD-10-CM

## 2022-01-03 DIAGNOSIS — Z3A30 30 weeks gestation of pregnancy: Secondary | ICD-10-CM

## 2022-01-03 DIAGNOSIS — O0990 Supervision of high risk pregnancy, unspecified, unspecified trimester: Secondary | ICD-10-CM

## 2022-01-03 NOTE — Progress Notes (Signed)
° °  PRENATAL VISIT NOTE  Subjective:  Elaine Green is a 27 y.o. G3P2002 at [redacted]w[redacted]d being seen today for ongoing prenatal care.  She is currently monitored for the following issues for this high-risk pregnancy and has Supervision of high risk pregnancy, antepartum; Rubella non-immune status, antepartum; Maternal morbid obesity, antepartum ([place]); GDM (gestational diabetes mellitus); and [redacted] weeks gestation of pregnancy on their problem list.  Patient doing well with no acute concerns today. She reports no complaints.  Contractions: Not present. Vag. Bleeding: None.  Movement: Present. Denies leaking of fluid.   The following portions of the patient's history were reviewed and updated as appropriate: allergies, current medications, past family history, past medical history, past social history, past surgical history and problem list. Problem list updated.  Objective:   Vitals:   01/03/22 1127  BP: 102/83  Pulse: 97  Weight: 239 lb 4.8 oz (108.5 kg)    Fetal Status: Fetal Heart Rate (bpm): 146 Fundal Height: 31 cm Movement: Present     General:  Alert, oriented and cooperative. Patient is in no acute distress.  Skin: Skin is warm and dry. No rash noted.   Cardiovascular: Normal heart rate noted  Respiratory: Normal respiratory effort, no problems with respiration noted  Abdomen: Soft, gravid, appropriate for gestational age.  Pain/Pressure: Absent     Pelvic: Cervical exam deferred        Extremities: Normal range of motion.  Edema: None  Mental Status:  Normal mood and affect. Normal behavior. Normal judgment and thought content.   Assessment and Plan:  Pregnancy: G3P2002 at [redacted]w[redacted]d  1. [redacted] weeks gestation of pregnancy   2. Diet controlled gestational diabetes mellitus (GDM) in third trimester Pt has new diagnosis, has diabetic education on 01/04/22, glucose monitoring sheets given  3. Supervision of high risk pregnancy, antepartum Continue routine care Pt has [name] follow  up/growth on 01/15/22  4. Rubella non-immune status, antepartum Treat after delivery  5. Maternal morbid obesity, antepartum ([place]) Monitor weights  Preterm labor symptoms and general obstetric precautions including but not limited to vaginal bleeding, contractions, leaking of fluid and fetal movement were reviewed in detail with the patient.  Please refer to After Visit Summary for other counseling recommendations.   Return in about 2 weeks (around 01/17/2022) for [facility], in person.   Lynnda Shields, MD Faculty Attending Center for [facility]

## 2022-01-04 ENCOUNTER — Ambulatory Visit: Payer: Self-pay | Admitting: Registered"

## 2022-01-04 DIAGNOSIS — O24410 Gestational diabetes mellitus in pregnancy, diet controlled: Secondary | ICD-10-CM

## 2022-01-04 NOTE — Progress Notes (Signed)
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 Pt states has a hard time getting sleep because the baby is active.   Pt states she had not had gestational diabetes before.  Physical Activity: ADL Stress: not assessed Sleep: "can't sleep good"  24 hr Recall:  First Meal: coffee, milk with a little sugar; sometimes with sweet bread or banana Snack: Second meal: 3 tortillas, meat, smoothie with milk and banana Snack: Third meal: chicken, broccoli Snack: Beverages: coffee, water, orange juice  NUTRITION INTERVENTION  Nutrition education (E-1) on the following topics:   Initial Follow-up  [x]  []  Definition of Gestational Diabetes [x]  []  Why dietary management is important in controlling blood glucose [x]  []  Effects each nutrient has on blood glucose levels []  []  Simple carbohydrates vs complex carbohydrates []  []  Fluid intake [x]  []  Creating a balanced meal plan [x]  []  Carbohydrate counting  [x]  []  When to check blood glucose levels [x]  []  Proper blood glucose monitoring techniques [x]  []  Effect of stress and stress reduction techniques  [x]  []  Exercise effect on blood glucose levels, appropriate exercise during pregnancy []  []  Importance of limiting caffeine and abstaining from alcohol and smoking []  []  Medications used for blood sugar control during pregnancy []  []  Hypoglycemia and rule of 15 [x]  []  Postpartum self care  Blood glucose monitor given: Prodigy Lot # CBG: 94 mg/dL  Patient instructed to monitor glucose levels: FBS: 60 - ? 95 mg/dL (some clinics use 90 for cutoff) 1 hour: ? 140 mg/dL 2 hour: ? mg/dL  Patient received handouts: Nutrition Diabetes and  Pregnancy Carbohydrate Counting List  Patient will be seen for follow-up as needed.

## 2022-01-05 ENCOUNTER — Encounter: Payer: Self-pay | Attending: Family Medicine | Admitting: Registered"

## 2022-01-05 DIAGNOSIS — Z3A00 Weeks of gestation of pregnancy not specified: Secondary | ICD-10-CM | POA: Insufficient documentation

## 2022-01-05 DIAGNOSIS — O24419 Gestational diabetes mellitus in pregnancy, unspecified control: Secondary | ICD-10-CM | POA: Insufficient documentation

## 2022-01-08 ENCOUNTER — Other Ambulatory Visit: Payer: Self-pay

## 2022-01-15 ENCOUNTER — Other Ambulatory Visit: Payer: Self-pay | Admitting: *Deleted

## 2022-01-15 ENCOUNTER — Ambulatory Visit: Payer: Self-pay | Attending: Obstetrics & Gynecology

## 2022-01-15 ENCOUNTER — Ambulatory Visit: Payer: Self-pay | Admitting: *Deleted

## 2022-01-15 ENCOUNTER — Other Ambulatory Visit: Payer: Self-pay

## 2022-01-15 VITALS — BP 105/57 | HR 75

## 2022-01-15 DIAGNOSIS — O24419 Gestational diabetes mellitus in pregnancy, unspecified control: Secondary | ICD-10-CM

## 2022-01-15 DIAGNOSIS — O99210 Obesity complicating pregnancy, unspecified trimester: Secondary | ICD-10-CM | POA: Insufficient documentation

## 2022-01-15 DIAGNOSIS — O24410 Gestational diabetes mellitus in pregnancy, diet controlled: Secondary | ICD-10-CM | POA: Insufficient documentation

## 2022-01-15 DIAGNOSIS — O0990 Supervision of high risk pregnancy, unspecified, unspecified trimester: Secondary | ICD-10-CM

## 2022-01-15 DIAGNOSIS — R638 Other symptoms and signs concerning food and fluid intake: Secondary | ICD-10-CM

## 2022-01-17 ENCOUNTER — Ambulatory Visit (INDEPENDENT_AMBULATORY_CARE_PROVIDER_SITE_OTHER): Payer: Self-pay | Admitting: Obstetrics and Gynecology

## 2022-01-17 ENCOUNTER — Other Ambulatory Visit: Payer: Self-pay

## 2022-01-17 VITALS — BP 107/64 | HR 92 | Wt 240.5 lb

## 2022-01-17 DIAGNOSIS — O99210 Obesity complicating pregnancy, unspecified trimester: Secondary | ICD-10-CM

## 2022-01-17 DIAGNOSIS — Z6841 Body Mass Index (BMI) 40.0 and over, adult: Secondary | ICD-10-CM

## 2022-01-17 DIAGNOSIS — O0990 Supervision of high risk pregnancy, unspecified, unspecified trimester: Secondary | ICD-10-CM

## 2022-01-17 DIAGNOSIS — O24410 Gestational diabetes mellitus in pregnancy, diet controlled: Secondary | ICD-10-CM

## 2022-01-17 DIAGNOSIS — Z5941 Food insecurity: Secondary | ICD-10-CM

## 2022-01-17 DIAGNOSIS — Z789 Other specified health status: Secondary | ICD-10-CM

## 2022-01-17 DIAGNOSIS — Z3A32 32 weeks gestation of pregnancy: Secondary | ICD-10-CM

## 2022-01-17 DIAGNOSIS — R432 Parageusia: Secondary | ICD-10-CM

## 2022-01-17 DIAGNOSIS — R55 Syncope and collapse: Secondary | ICD-10-CM

## 2022-01-17 HISTORY — DX: Body Mass Index (BMI) 40.0 and over, adult: Z684

## 2022-01-17 HISTORY — DX: Parageusia: R43.2

## 2022-01-17 NOTE — Progress Notes (Signed)
° ° °  PRENATAL VISIT NOTE  Subjective:  Elaine Green is a 27 y.o. G3P2002 at [redacted]w[redacted]d being seen today for ongoing prenatal care.  She is currently monitored for the following issues for this high-risk pregnancy and has Supervision of high risk pregnancy, antepartum; Rubella non-immune status, antepartum; Maternal morbid obesity, antepartum ([place]); GDM (gestational diabetes mellitus); BMI 40.0-44.9, adult ([place]); Language barrier; and Loss of taste on their problem list.  Patient reports  see below .  Contractions: Irritability. Vag. Bleeding: None.  Movement: Present. Denies leaking of fluid.   The following portions of the patient's history were reviewed and updated as appropriate: allergies, current medications, past family history, past medical history, past social history, past surgical history and problem list.   Objective:   Vitals:   01/17/22 0952  BP: 107/64  Pulse: 92  Weight: 240 lb 8 oz (109.1 kg)    Fetal Status: Fetal Heart Rate (bpm): 146   Movement: Present     General:  Alert, oriented and cooperative. Patient is in no acute distress.  Skin: Skin is warm and dry. No rash noted.   Cardiovascular: Normal heart rate noted. Normal s1, s2, no mrgs  Respiratory: Ctab Normal respiratory effort, no problems with respiration noted  Abdomen: Soft, gravid, appropriate for gestational age.  Pain/Pressure: Absent     Pelvic: Cervical exam deferred        Extremities: Normal range of motion.  Edema: None  Mental Status: Normal mood and affect. Normal behavior. Normal judgment and thought content.  Neuro:  Gait normal. CN 2-12 grossly intact, EOMI, PERRL, normal strength and sensation diffusely, 2+ brachial and patella DTRs  Assessment and Plan:  Pregnancy: G3P2002 at [redacted]w[redacted]d 1. [redacted] weeks gestation of pregnancy  2. Supervision of high risk pregnancy, antepartum  3. Food insecurity - AMBULATORY REFERRAL TO [place] [facility]  4. Loss of taste Started last week. I  told her to stop using mouth wash to see if that can help  5. Pre-syncope Had episode where she felt faint last week. Normal exam. Recommend staying well hydrated and resting and if s/s occur again to let us know for labs and work up  6. Language barrier Interpreter used  7. Maternal morbid obesity, antepartum ([place])  8. Diet controlled gestational diabetes mellitus (GDM) in third trimester Normal cbg log 1/16: 77%, 2071g, ac 88%, afi 18.4  9. BMI 40.0-44.9, adult ([place]) Has rpt growth scan and to start qwk testing at 34wks per mfm  Preterm labor symptoms and general obstetric precautions including but not limited to vaginal bleeding, contractions, leaking of fluid and fetal movement were reviewed in detail with the patient. Please refer to After Visit Summary for other counseling recommendations.   No follow-ups on file.  Future Appointments  Date Time Provider [place]  02/05/2022 10:15 AM [facility] NST [facility]  02/05/2022 11:15 AM Chancy Milroy, MD [facility]  02/12/2022  9:30 AM [facility] NURSE [facility]  02/12/2022  9:30 AM [facility] US3 [facility]  02/19/2022 10:15 AM [facility] NST [facility]  02/19/2022 11:15 AM Chancy Milroy, MD [facility]  02/26/2022  8:15 AM [facility] NST [facility]  02/26/2022  9:15 AM Griffin Basil, MD [facility]    Aletha Halim, MD

## 2022-01-26 ENCOUNTER — Telehealth: Payer: Self-pay | Admitting: Obstetrics and Gynecology

## 2022-01-26 NOTE — Telephone Encounter (Signed)
Patient called in with concerns about getting her Blood Sugar checked

## 2022-01-26 NOTE — Telephone Encounter (Signed)
Called pt with Spanish Interpreter Raquel M., and asked pt to verify her concern about her blood sugars.   Pt states that she was advised by the provider that she needed to come in one week for blood sugars.  Per chart review, provider indicated that pt's blood sugars are normal and no documentation for the need for pt to come in one week.  I advised pt that her sugars are fine at this time and that we will f/u with her at her NST/BPP and provider app on 02/05/22.  Pt verbalized understanding.   Addison Naegeli, RN  11/26/22

## 2022-02-05 ENCOUNTER — Ambulatory Visit (INDEPENDENT_AMBULATORY_CARE_PROVIDER_SITE_OTHER): Payer: Self-pay | Admitting: General Practice

## 2022-02-05 ENCOUNTER — Other Ambulatory Visit: Payer: Self-pay

## 2022-02-05 ENCOUNTER — Ambulatory Visit (INDEPENDENT_AMBULATORY_CARE_PROVIDER_SITE_OTHER): Payer: Self-pay

## 2022-02-05 ENCOUNTER — Ambulatory Visit (INDEPENDENT_AMBULATORY_CARE_PROVIDER_SITE_OTHER): Payer: Self-pay | Admitting: Obstetrics and Gynecology

## 2022-02-05 ENCOUNTER — Encounter: Payer: Self-pay | Admitting: Obstetrics and Gynecology

## 2022-02-05 VITALS — BP 111/61 | HR 85

## 2022-02-05 DIAGNOSIS — O0990 Supervision of high risk pregnancy, unspecified, unspecified trimester: Secondary | ICD-10-CM

## 2022-02-05 DIAGNOSIS — Z5941 Food insecurity: Secondary | ICD-10-CM

## 2022-02-05 DIAGNOSIS — Z3A34 34 weeks gestation of pregnancy: Secondary | ICD-10-CM

## 2022-02-05 DIAGNOSIS — O09899 Supervision of other high risk pregnancies, unspecified trimester: Secondary | ICD-10-CM

## 2022-02-05 DIAGNOSIS — O99213 Obesity complicating pregnancy, third trimester: Secondary | ICD-10-CM

## 2022-02-05 DIAGNOSIS — O24410 Gestational diabetes mellitus in pregnancy, diet controlled: Secondary | ICD-10-CM

## 2022-02-05 DIAGNOSIS — Z2839 Other underimmunization status: Secondary | ICD-10-CM

## 2022-02-05 DIAGNOSIS — Z789 Other specified health status: Secondary | ICD-10-CM

## 2022-02-05 MED ORDER — METFORMIN HCL 500 MG PO TABS: 500.0000 mg | ORAL_TABLET | Freq: Two times a day (BID) | ORAL | 5 refills | Status: DC

## 2022-02-05 NOTE — Progress Notes (Signed)
Subjective:  Elaine Green is a 27 y.o. G3P2002 at [redacted]w[redacted]d being seen today for ongoing prenatal care.  She is currently monitored for the following issues for this high-risk pregnancy and has Supervision of high risk pregnancy, antepartum; Rubella non-immune status, antepartum; Maternal morbid obesity, antepartum ([place]); GDM (gestational diabetes mellitus); BMI 40.0-44.9, adult ([place]); Language barrier; and Loss of taste on their problem list.  Patient reports no complaints.  Contractions: Irritability. Vag. Bleeding: None.  Movement: Present. Denies leaking of fluid.   The following portions of the patient's history were reviewed and updated as appropriate: allergies, current medications, past family history, past medical history, past social history, past surgical history and problem list. Problem list updated.  Objective:   Vitals:   02/05/22 1039  BP: 111/61  Pulse: 85    Fetal Status: Fetal Heart Rate (bpm): NST   Movement: Present     General:  Alert, oriented and cooperative. Patient is in no acute distress.  Skin: Skin is warm and dry. No rash noted.   Cardiovascular: Normal heart rate noted  Respiratory: Normal respiratory effort, no problems with respiration noted  Abdomen: Soft, gravid, appropriate for gestational age. Pain/Pressure: Absent     Pelvic:  Cervical exam deferred        Extremities: Normal range of motion.  Edema: None  Mental Status: Normal mood and affect. Normal behavior. Normal judgment and thought content.   Urinalysis:      Assessment and Plan:  Pregnancy: G3P2002 at [redacted]w[redacted]d  1. Supervision of high risk pregnancy, antepartum Stable  2. Diet controlled gestational diabetes mellitus (GDM) in third trimester CBG's not in goal range Will start Metformin Indications reviewed Will continue with serial growth scans and weekly antenatal testing - metFORMIN (GLUCOPHAGE) 500 MG tablet; Take 1 tablet (500 mg total) by mouth 2 (two) times daily with  a meal.  Dispense: 60 tablet; Refill: 5  3. Language barrier Live interrupter used during today's visit  4. Rubella non-immune status, antepartum Vaccine PP  Preterm labor symptoms and general obstetric precautions including but not limited to vaginal bleeding, contractions, leaking of fluid and fetal movement were reviewed in detail with the patient. Please refer to After Visit Summary for other counseling recommendations.  Return in about 2 weeks (around 02/19/2022) for OB visit.   Chancy Milroy, MD

## 2022-02-05 NOTE — Progress Notes (Signed)
Pt informed that the ultrasound is considered a limited OB ultrasound and is not intended to be a complete ultrasound exam.  Patient also informed that the ultrasound is not being completed with the intent of assessing for fetal or placental anomalies or any pelvic abnormalities.  Explained that the purpose of today’s ultrasound is to assess for  BPP, presentation, and AFI.  Patient acknowledges the purpose of the exam and the limitations of the study.    ° °Eulis Salazar H RN BSN °02/05/22 ° °

## 2022-02-05 NOTE — Patient Instructions (Signed)
Tercer trimestre de embarazo °Third Trimester of Pregnancy °El tercer trimestre de embarazo va desde la semana 28 hasta la semana 40. También se dice que va desde el mes 7 hasta el mes 9. En este trimestre, el bebé en gestación (feto) crece muy rápidamente. Hacia el final del noveno mes, el bebé en gestación mide alrededor de 20 pulgadas (45 cm) de largo. Pesa entre 6 y 10 libras (2,70 y 4,50 kg). °Cambios en el cuerpo durante el tercer trimestre °Su organismo continúa atravesando por muchos cambios durante este período. Los cambios varían y generalmente vuelven a la normalidad después del nacimiento del bebé. °Cambios físicos °Seguirá aumentando de peso. Puede ser que aumente entre 25 y 35 libras (11 y 16 kg) hacia el final del embarazo. Si tiene bajo peso, puede aumentar entre 28 y 40 lb (unos 13 a 18 kg). Si tiene sobrepeso, puede aumentar entre 15 y 25 libras (unos 7 a 11 kg). °Podrán aparecer las primeras estrías en las caderas, el vientre (abdomen) y las mamas. °Las mamas seguirán creciendo y pueden doler. Un líquido amarillo (calostro) puede salir de sus pechos. Esta es la primera leche que usted produce para el bebé. °Tal vez haya cambios en el cabello. °El ombligo puede salir hacia afuera. °Puede observar que se le hinchan más las manos, la cara o los tobillos. °Cambios en la salud °Es posible que tenga acidez estomacal. °Es posible que tenga dificultades para defecar (estreñimiento). °Pueden aparecerle hemorroides. Estas son venas hinchadas en el ano que pueden picar o doler. °Puede comenzar a tener venas hinchadas (várices) en las piernas. °Puede presentar más dolor en la pelvis, la espalda o los muslos. °Puede presentar más hormigueo o entumecimiento en las manos, los brazos y las piernas. La piel de su vientre también puede sentirse entumecida. °Es posible que sienta falta de aire a medida que el útero se agranda. °Otros cambios °Es posible que haga pis (orine) con mayor frecuencia. °Puede tener más  problemas para dormir. °Puede notar que el bebé en gestación “baja” o se mueve más hacia bajo, en el vientre. °Puede notar más secreción proveniente de la vagina. °Puede sentir las articulaciones flojas y puede sentir dolor alrededor del hueso pélvico. °Siga estas instrucciones en su casa: °Medicamentos °Use los medicamentos de venta libre y los recetados solamente como se lo haya indicado el médico. Algunos medicamentos no son seguros durante el embarazo. °Tome vitaminas prenatales que contengan por lo menos 600 microgramos (mcg) de ácido fólico. °Comida y bebida °Consuma comidas saludables que incluyan lo siguiente: °Frutas y verduras frescas. °Cereales integrales. °Buenas fuentes de proteínas, como carne, huevos y tofu. °Productos lácteos con bajo contenido de grasa. °Evite la carne cruda y el jugo, la leche y el queso sin pasteurizar. Estos portan gérmenes que pueden provocar daño tanto a usted como al bebé. °Tome 4 o 5 comidas pequeñas en lugar de 3 comidas abundantes al día. °Es posible que deba tomar medidas para prevenir o tratar los problemas para defecar: °Beber suficiente líquido para mantener el pis (orina) de color amarillo pálido. °Come alimentos ricos en fibra. Entre ellos, frijoles, cereales integrales y frutas y verduras frescas. °Limitar los alimentos con alto contenido de grasa y azúcar. Estos incluyen alimentos fritos o dulces. °Actividad °Haga ejercicios solamente como se lo haya indicado el médico. Interrumpa la actividad física si comienza a tener cólicos en el útero. °Evite levantar pesos excesivos. °No haga ejercicio si hace demasiado calor, hay demasiada humedad o se encuentra en un lugar de mucha altura (  altitud elevada). °Si lo desea, puede continuar teniendo relaciones sexuales, a menos que el médico le indique lo contrario. °Alivio del dolor y del malestar °Haga pausas con frecuencia y descanse con las piernas levantadas (elevadas) si tiene calambres en las piernas o dolor en la parte  baja de la espalda. °Dese baños de asiento con agua tibia para aliviar el dolor o las molestias causadas por las hemorroides. Use una crema para las hemorroides si el médico la autoriza. °Use un sostén que le brinde buen soporte si sus mamas están sensibles. °Si desarrolla venas hinchadas y abultadas en las piernas: °Use medias de compresión según las indicaciones de su médico. °Levante los pies durante 15 minutos, 3 o 4 veces por día. °Limite la sal en sus alimentos. °Seguridad °Hable con el médico antes de recorrer largas distancias. °No se dé baños de inmersión en agua caliente, baños turcos ni saunas. °Use el cinturón de seguridad en todo momento mientras vaya en auto. °Hable con el médico si alguien le está haciendo daño o gritando mucho. °Preparación para la llegada del bebé °Para prepararse para la llegada de su bebé: °Tome clases prenatales. °Visite el hospital y recorra el área de maternidad. °Compre un asiento de seguridad orientado hacia atrás para llevar al bebé en el automóvil. Aprenda cómo instalarlo en el auto. °Prepare la habitación del bebé. Saque todas las almohadas y los animales de peluche de la cuna del bebé. °Instrucciones generales °Evite el contacto con las bandejas sanitarias de los gatos y la tierra que estos animales usan. Estos contienen gérmenes que pueden dañar al bebé y causar la pérdida del bebé ya sea aborto espontáneo o muerte fetal. °No se haga duchas vaginales ni use tampones. No use tampones ni toallas higiénicas perfumadas. °No fume ni consuma ningún producto que contenga nicotina o tabaco. Si necesita ayuda para dejar de fumar, consulte al médico. °No beba alcohol. °No use medicamentos a base de hierbas, drogas ilegales, ni medicamentos que el médico no haya autorizado. Las sustancias químicas de estos productos pueden afectar al bebé. °Cumpla con todas las visitas de seguimiento. Esto es importante. °Dónde buscar más información °American Pregnancy Association (Asociación  Americana del Embarazo): americanpregnancy.org °American College of Obstetricians and Gynecologists (Colegio Estadounidense de Obstetras y Ginecólogos): www.acog.org °Office on Women's Health (Oficina para la Salud de la Mujer): womenshealth.gov/pregnancy °Comuníquese con un médico si: °Tiene fiebre. °Tiene cólicos leves o siente presión en la parte baja del vientre. °Sufre un dolor persistente en el abdomen. °Vomita o hace deposiciones acuosas (diarrea). °Advierte líquido con mal olor que proviene de la vagina. °Siente dolor al orinar o hace orina con mal olor. °Tiene un dolor de cabeza que no desaparece después de tomar analgésicos. °Nota cambios en la visión o ve manchas delante de los ojos. °Solicite ayuda de inmediato si: °Rompe la bolsa. °Tiene contracciones regulares separadas por menos de 5 minutos. °Tiene sangrado o pequeñas pérdidas vaginales. °Tiene cólicos o dolor muy intensos en el vientre. °Tiene dificultad para respirar. °Sientes dolor en el pecho. °Se desmaya. °No ha sentido al bebé moverse durante el tiempo que le indicó el médico. °Tiene dolor, hinchazón o enrojecimiento nuevos en un brazo o una pierna o se produce un aumento de alguno de estos síntomas. °Resumen °El tercer trimestre comprende desde la semana 28 hasta la semana 40 (desde el mes 7 hasta el mes 9). Esta es la época en que el bebé en gestación crece muy rápidamente. °Durante este período, las molestias pueden aumentar a medida que usted sube   de peso y el bebé crece. °Prepárese para la llegada del bebé: asista a las clases prenatales, compre un asiento de seguridad orientado hacia atrás para llevar al bebé en auto y prepare la habitación del bebé. °Solicite ayuda de inmediato si tiene sangrado por la vagina, siente dolor en el pecho y tiene dificultad para respirar, o si no ha sentido al bebé moverse durante el tiempo que le indicó el médico. °Esta información no tiene como fin reemplazar el consejo del médico. Asegúrese de hacerle al  médico cualquier pregunta que tenga. °Document Revised: 06/29/2020 Document Reviewed: 06/29/2020 °Elsevier Patient Education © 2022 Elsevier Inc. ° °

## 2022-02-12 ENCOUNTER — Other Ambulatory Visit: Payer: Self-pay

## 2022-02-12 ENCOUNTER — Ambulatory Visit: Payer: Self-pay | Admitting: *Deleted

## 2022-02-12 ENCOUNTER — Ambulatory Visit: Payer: Self-pay | Attending: Obstetrics and Gynecology

## 2022-02-12 ENCOUNTER — Other Ambulatory Visit: Payer: Self-pay | Admitting: Obstetrics and Gynecology

## 2022-02-12 DIAGNOSIS — E669 Obesity, unspecified: Secondary | ICD-10-CM

## 2022-02-12 DIAGNOSIS — Z3A35 35 weeks gestation of pregnancy: Secondary | ICD-10-CM

## 2022-02-12 DIAGNOSIS — R638 Other symptoms and signs concerning food and fluid intake: Secondary | ICD-10-CM

## 2022-02-12 DIAGNOSIS — O0990 Supervision of high risk pregnancy, unspecified, unspecified trimester: Secondary | ICD-10-CM

## 2022-02-12 DIAGNOSIS — O99210 Obesity complicating pregnancy, unspecified trimester: Secondary | ICD-10-CM | POA: Insufficient documentation

## 2022-02-12 DIAGNOSIS — O99213 Obesity complicating pregnancy, third trimester: Secondary | ICD-10-CM

## 2022-02-12 DIAGNOSIS — O24415 Gestational diabetes mellitus in pregnancy, controlled by oral hypoglycemic drugs: Secondary | ICD-10-CM

## 2022-02-12 DIAGNOSIS — O24419 Gestational diabetes mellitus in pregnancy, unspecified control: Secondary | ICD-10-CM

## 2022-02-12 NOTE — Procedures (Signed)
Elaine Green [birth year] [redacted]w[redacted]d  Fetus A Non-Stress Test Interpretation for 02/12/22  Indication: IUGR, Gestational Diabetes medication controlled, and Unsatisfactory BPP  Fetal Heart Rate A Mode: External Baseline Rate (A): 145 bpm Variability: Moderate Accelerations: 15 x 15 Decelerations: None Multiple birth?: No  Uterine Activity Mode: Palpation, Toco Contraction Frequency (min): Occas UI Contraction Quality: Mild Resting Tone Palpated: Relaxed Resting Time: Adequate  Interpretation (Fetal Testing) Nonstress Test Interpretation: Reactive Comments: Dr. Donalee Citrin reviewed tracing.

## 2022-02-19 ENCOUNTER — Other Ambulatory Visit: Payer: Self-pay

## 2022-02-19 ENCOUNTER — Other Ambulatory Visit (HOSPITAL_COMMUNITY)
Admission: RE | Admit: 2022-02-19 | Discharge: 2022-02-19 | Disposition: A | Discharge status: Home / Self Care | Payer: Self-pay | Source: Ambulatory Visit | Attending: Obstetrics and Gynecology | Admitting: Obstetrics and Gynecology

## 2022-02-19 ENCOUNTER — Other Ambulatory Visit (HOSPITAL_COMMUNITY): Payer: Self-pay | Admitting: Advanced Practice Midwife

## 2022-02-19 ENCOUNTER — Ambulatory Visit (INDEPENDENT_AMBULATORY_CARE_PROVIDER_SITE_OTHER): Payer: Self-pay

## 2022-02-19 ENCOUNTER — Ambulatory Visit (INDEPENDENT_AMBULATORY_CARE_PROVIDER_SITE_OTHER): Payer: Self-pay | Admitting: Obstetrics and Gynecology

## 2022-02-19 ENCOUNTER — Encounter: Payer: Self-pay | Admitting: Obstetrics and Gynecology

## 2022-02-19 ENCOUNTER — Ambulatory Visit (INDEPENDENT_AMBULATORY_CARE_PROVIDER_SITE_OTHER): Payer: Self-pay | Admitting: General Practice

## 2022-02-19 VITALS — BP 124/69 | HR 85 | Wt 240.0 lb

## 2022-02-19 DIAGNOSIS — O24415 Gestational diabetes mellitus in pregnancy, controlled by oral hypoglycemic drugs: Secondary | ICD-10-CM

## 2022-02-19 DIAGNOSIS — O0990 Supervision of high risk pregnancy, unspecified, unspecified trimester: Secondary | ICD-10-CM

## 2022-02-19 DIAGNOSIS — Z2839 Other underimmunization status: Secondary | ICD-10-CM

## 2022-02-19 DIAGNOSIS — O09899 Supervision of other high risk pregnancies, unspecified trimester: Secondary | ICD-10-CM

## 2022-02-19 DIAGNOSIS — Z789 Other specified health status: Secondary | ICD-10-CM

## 2022-02-19 LAB — OB RESULTS CONSOLE GC/CHLAMYDIA: Gonorrhea: NEGATIVE

## 2022-02-19 NOTE — Progress Notes (Signed)
IOL scheduled 2/26 in AM

## 2022-02-19 NOTE — Progress Notes (Signed)
Pt informed that the ultrasound is considered a limited OB ultrasound and is not intended to be a complete ultrasound exam.  Patient also informed that the ultrasound is not being completed with the intent of assessing for fetal or placental anomalies or any pelvic abnormalities.  Explained that the purpose of today’s ultrasound is to assess for  BPP, presentation, and AFI.  Patient acknowledges the purpose of the exam and the limitations of the study.    ° °Murice Barbar H RN BSN °02/19/22 ° °

## 2022-02-19 NOTE — Patient Instructions (Addendum)
Parto vaginal Vaginal Delivery Parto vaginal significa que usted da a luz empujando al beb fuera del canal del parto (vagina). Su equipo de atencin mdica la ayudar antes, durante y despus del parto vaginal. Las experiencias de los nacimientos son nicas para todas las [place], y Sports administrator y las experiencias de nacimiento varan segn dnde elija dar a luz. Cules son los riesgos y beneficios? En general, esto es seguro. Sin embargo, pueden ocurrir complicaciones, por ejemplo: Sangrado. Infeccin. Dao a otras estructuras, como desgarro vaginal. Reacciones alrgicas a los medicamentos. A pesar de los riesgos, los beneficios del parto vaginal incluyen un menor riesgo de sangrado e infeccin y un menor tiempo de recuperacin en comparacin con el parto por cesrea. El parto por cesrea es el parto del beb por medios quirrgicos. Ladell Heads ocurrir cuando llegue al centro de [place] o al hospital? Pollyann Savoy que se inicie el [place] de parto y haya sido admitida en el hospital o centro de [place], su equipo de atencin mdica podr hacer lo siguiente: Revisar sus antecedentes de Psychiatrist y cualquier inquietud que usted pueda Warehouse manager. Hablar con usted sobre su plan de nacimiento y Chiropractor las opciones para Human resources officer. Controlarle la presin arterial, la respiracin y los latidos cardacos. Evaluar los latidos cardacos del beb. Monitorear el tero para Tree surgeon. Verificar si la bolsa de agua (saco amnitico) se ha roto (ruptura). Colocarle una va intravenosa en una de las venas. Esto se podr usar para administrarle lquidos y medicamentos. Monitoreo Su equipo de atencin mdica puede Development worker, community las contracciones (monitoreo uterino) y la frecuencia cardaca del beb (monitoreo fetal). Es posible que el monitoreo se necesite realizar: Con frecuencia, pero no continuamente (intermitentemente). Todo el tiempo o durante largos perodos a la vez (continuamente). El monitoreo continuo  puede ser necesario si: Est recibiendo determinados medicamentos, tales como medicamentos para Engineer, materials o para hacer que las contracciones sean ms fuertes. Tiene complicaciones durante el embarazo o el [place] de [place]. El monitoreo se puede realizar: Al colocar un estetoscopio especial o un dispositivo manual de monitoreo en el abdomen o verificar los latidos cardacos del beb y comprobar las contracciones. Al colocar monitores en el abdomen (monitores externos) para Passenger transport manager los latidos cardacos del beb y la frecuencia y duracin de las contracciones. Al colocar monitores dentro del tero a travs de la vagina (monitores internos) para Passenger transport manager los latidos cardacos del beb y la frecuencia, duracin y fuerza de sus contracciones. Segn el tipo de monitor, Insurance claims handler en el tero o en la cabeza del beb hasta el nacimiento. Telemetra. Se trata de un tipo de monitoreo continuo que se puede Education officer, environmental con monitores externos o internos. En lugar de Hospital doctor en la cama, usted puede desplazarse. Examen fsico Su equipo de atencin mdica puede Medical illustrator fsicos frecuentes. Esto puede incluir: Investment banker, operational cmo y dnde el beb est ubicado en el tero. Verificar el cuello uterino para determinar: Si se est afinando o estirando (borrando). Si se est abriendo (dilatando). Qu sucede durante el [place] de parto y [place]? El [place] de parto y el parto normales se dividen en tres etapas: Etapa 1 Esta es la etapa ms larga del trabajo de [place]. Durante esta etapa, sentir contracciones. En general, las contracciones son leves, infrecuentes e irregulares al principio. Se hacen ms fuertes, ms frecuentes y ms regulares a medida que avanza en esta etapa. Puede tener contracciones cada 2 o 3 minutos. Esta etapa finaliza cuando el cuello uterino est completamente dilatado [employer]  4 pulgadas (10 cm) y completamente borrado. Etapa 2 Esta etapa comienza una vez que el  cuello uterino est totalmente borrado y dilatado, y dura hasta el nacimiento del beb. Esta es la etapa en la que va a sentir ganas de pujar al beb fuera de la vagina. Puede sentir un dolor urente y por estiramiento, especialmente cuando la parte ms ancha de la cabeza del beb pasa a travs de la abertura vaginal (coronacin). Una vez que el beb nace, el cordn umbilical se pinzar y se cortar. El momento de cortar el cordn depender de sus deseos, de la salud del beb y de los mtodos del mdico. Clinical biochemist al beb sobre su pecho desnudo (contacto piel con piel) en una posicin erguida y [place] con [place] Pita abrigada. Si optar por amamantar, observe al beb para detectar seales de hambre, como el reflejo de bsqueda o succin, y acrquelo al pecho para su primera alimentacin. Etapa 3 Esta etapa comienza inmediatamente despus del nacimiento del beb y finaliza despus de la expulsin de la placenta. Esta etapa puede durar de 5 a 30 minutos. Despus del nacimiento del beb, puede sentir contracciones cuando el cuerpo expulsa la placenta. Estas contracciones tambin ayudan a que el tero reduzca su tamao y a Warehouse manager. Qu puedo esperar despus del Aleen Campi de parto y [place]? Una vez que termina el [place] de [place], se los evaluar a usted y al beb atentamente hasta que estn listos para ir a casa. Su equipo de atencin Art gallery manager cmo cuidarse y cuidar a su beb. Le recomendarn que usted y el beb permanezcan en la misma sala (cohabitacin) durante su estada en el hospital. Esto ayudar a fomentar un vnculo temprano y Administrator, Civil Service. Se le controlar y Engineer, maintenance (IT) el tero con regularidad (masaje fndico). Puede seguir recibiendo lquidos o medicamentos por va intravenosa. Tendr algo de inflamacin y dolor en el abdomen, la vagina y la zona de la piel entre la abertura vaginal y el ano (perineo). Si se le realiz una incisin cerca de la vagina (episiotoma)  o si ha tenido Systems developer vaginal durante el parto, podran indicarle que se coloque compresas fras sobre la episiotoma o Art therapist. Esto ayuda a Engineer, materials y la hinchazn. Es normal tener hemorragia vaginal despus del [place]. Use un apsito sanitario para el sangrado vaginal y secrecin. Resumen Parto vaginal significa que usted dar a luz empujando al beb fuera del canal del parto (vagina). Su equipo de atencin mdica lo controlar a usted y al beb durante las etapas del [place]. Despus del parto, su equipo de atencin mdica continuar evalundolos a usted y al beb para asegurarse de que ambos se estn recuperando segn lo esperado despus del parto. Esta informacin no tiene Theme park manager el consejo del mdico. Asegrese de hacerle al mdico cualquier pregunta que tenga. Document Revised: 11/22/2020 Document Reviewed: 11/22/2020 Elsevier Patient Education  2022 Elsevier Inc. Tercer trimestre de Psychiatrist Third Trimester of Pregnancy El tercer trimestre de embarazo va desde la semana 28 hasta la semana 40. Esto corresponde a los meses 7 a 9. El tercer trimestre es un perodo en el que el beb en gestacin (feto) crece rpidamente. Hacia el final del noveno mes, el feto mide alrededor de 20 pulgadas (45 cm) de largo y pesa entre 6 y 10 libras (2.7 y 4.5 kg). Cambios en el cuerpo durante el tercer trimestre Durante el tercer trimestre, su cuerpo contina experimentando numerosos cambios. Los cambios varan y generalmente  vuelven a la normalidad despus del nacimiento del beb. Cambios fsicos Seguir American Standard Companiesaumentando de peso. Es de esperar que aumente entre 25 y 35 libras (11 y 16 kg) hacia el final del Psychiatristembarazo si inicia el Psychiatristembarazo con un peso normal. Si tiene bajo Cushingpeso, es de esperar que aumente entre 28 y 40 libras (13 y 18 kg), y si tiene sobrepeso, es de esperar que aumente entre 15 y 25 libras (7 y 11 kg). Podrn aparecer las primeras Albertson'sestras en las caderas, el abdomen y las  Arionmamas. Las ConAgra Foodsmamas seguirn creciendo y Writerpueden doler. Un lquido amarillo Charity fundraiser(calostro) puede salir de sus pechos. Esta es la primera leche que usted produce para su beb. Tal vez haya cambios en el cabello. Esto cambios pueden incluir su engrosamiento, crecimiento rpido y Allied Waste Industriescambios en la textura. A algunas personas tambin se les cae el cabello durante o despus del Laurelembarazo, o tienen el cabello seco o fino. El ombligo puede salir hacia afuera. Puede observar que se le Eli Lilly and Companyhinchan las manos, el rostro o los tobillos. Cambios en la salud Es posible que tenga acidez estomacal. Puede sufrir estreimiento. Puede desarrollar hemorroides. Puede desarrollar venas hinchadas y abultadas (venas varicosas) en las piernas. Puede presentar ms dolor en la pelvis, la espalda o los muslos. Esto se debe al Citigroupaumento de peso y al aumento de las hormonas que relajan las articulaciones. Puede presentar un aumento del hormigueo o entumecimiento en las manos, brazos y piernas. La piel de su abdomen tambin puede sentirse entumecida. Puede sentir que le falta el aire debido a que se expande el tero. Otros cambios Puede tener necesidad de Geographical information systems officerorinar con ms frecuencia porque el feto baja hacia la pelvis y ejerce presin sobre la vejiga. Puede tener ms problemas para dormir. Esto puede deberse al tamao de su abdomen, una mayor necesidad de orinar y un aumento en el metabolismo de su cuerpo. Puede notar que el feto baja o lo siente ms bajo, en el abdomen (aligeramiento). Puede tener un aumento de la secrecin vaginal. Puede notar que tiene dolor alrededor del hueso plvico a medida que el tero se distiende. Siga estas instrucciones en su casa: Medicamentos Siga las instrucciones del mdico en relacin con el uso de medicamentos. Durante el embarazo, hay medicamentos que pueden tomarse y otros que no. No tome ningn medicamento a menos que lo haya autorizado el mdico. Tome vitaminas prenatales que contengan por lo menos 600  microgramos (mcg) de cido flico. Comida y bebida Lleve una dieta saludable que incluya frutas y verduras frescas, cereales integrales, buenas fuentes de protenas como carnes Skylinemagras, huevos o tofu, y productos lcteos descremados. Evite la carne cruda y el Jenningsjugo, la [place] y el queso sin Market researcherpasteurizar. Estos portan grmenes que pueden provocar dao tanto a usted como al beb. Tome 4 o 5 comidas pequeas en lugar de 3 comidas abundantes al da. Es posible que tenga que tomar estas medidas para prevenir o tratar el estreimiento: Product managerBeber suficiente lquido como para Pharmacologistmantener la orina de color amarillo plido. Consumir alimentos ricos en fibra, como frijoles, cereales integrales, y frutas y verduras frescas. Limitar el consumo de alimentos ricos en grasa y azcares procesados, como los alimentos fritos o dulces. Actividad Haga ejercicio solamente como se lo haya indicado el mdico. La mayora de las personas pueden continuar su actividad fsica habitual durante el Artondaleembarazo. Intente realizar como mnimo 30 minutos de actividad fsica por lo menos 5 das a la Warrentonsemana. Deje de hacer ejercicio si experimenta contracciones en el tero. Deje  de hacer ejercicio si le aparecen dolor o clicos en la parte baja del vientre o de la espalda. Evite levantar pesos [employer] Brands. No haga ejercicio si hace mucho calor o humedad, o si se encuentra a una altitud elevada. Si lo desea, puede seguir teniendo [employer] [place] Travelers, salvo que el mdico le indique lo contrario. Alivio del dolor y del [place] pausas frecuentes y descanse con las piernas levantadas (elevadas) si tiene calambres en las piernas o dolor en la parte baja de la espalda. Dese baos de asiento con agua tibia para Engineer, materials o las molestias causadas por las hemorroides. Use una crema para las hemorroides si el mdico la autoriza. Use un sujetador que le brinde buen soporte para prevenir las molestias causadas por la sensibilidad en las [place]. Si  tiene venas varicosas: Use medias de compresin como se lo haya indicado el mdico. Eleve los pies durante 15 minutos, 3 o 4 veces por da. Limite el consumo de sal en su dieta. Seguridad Hable con su mdico antes de viajar distancias largas. No se d baos de inmersin en agua caliente, baos turcos ni saunas. Use el cinturn de seguridad en todo momento mientras conduce o va en auto. Hable con el mdico si es vctima de [employer] o fsico. Preparacin para el nacimiento Para prepararse para la llegada de su beb: Tome clases prenatales para entender, Education administrator, y hacer preguntas sobre el [place] de parto y [place]. Visite el hospital y recorra el rea de maternidad. Compre un asiento de seguridad [employer]Energy [employer], y asegrese de saber cmo instalarlo en su automvil. Prepare la habitacin o el lugar donde dormir el beb. Asegrese de quitar todas las almohadas y [place] de peluche de la cuna del beb para evitar la asfixia. Indicaciones generales Evite el contacto con las bandejas sanitarias de los gatos y la tierra que estos animales usan. Estos alimentos contienen bacterias que pueden causar defectos congnitos en el beb. Si tiene Financial controller, pdale a alguien que limpie la caja de arena por usted. No se haga lavados vaginales ni use tampones. No use toallas higinicas perfumadas. No consuma ningn producto que contenga nicotina o tabaco, como cigarrillos, cigarrillos electrnicos y tabaco de Theatre manager. Si necesita ayuda para dejar de consumir estos productos, consulte al mdico. No use ningn remedio a base de hierbas, drogas ilegales o medicamentos que no le hayan sido recetados. Las sustancias qumicas de estos productos pueden daar al beb. No beba alcohol. Le realizarn exmenes prenatales ms frecuentes durante el tercer trimestre. Durante una visita prenatal de rutina, el mdico le har un examen fsico, Civil engineer, contracting pruebas y Heritage manager con usted de su salud general. Cumpla con  todas las visitas de seguimiento. Esto es importante. Dnde buscar ms informacin American Pregnancy Association (Asociacin Estadounidense del Embarazo): americanpregnancy.org Celanese Corporation of Obstetricians and Gynecologists (Colegio Estadounidense de Obstetras y [place]): EmploymentAssurance.cz? Office on Pitney Bowes (Oficina para la Salud de la Mujer): MightyReward.co.nz Comunquese con un mdico si tiene: Teacher, English as a foreign language. Clicos leves en la pelvis, presin en la pelvis o dolor persistente en la zona abdominal o la parte baja de la espalda. Vmitos o diarrea. Secrecin vaginal con mal olor u orina con mal olor. Dolor al Beatrix Shipper. Un dolor de cabeza que no desaparece despus de Science writer. Cambios en la visin o ve manchas delante de los ojos. Solicite ayuda de inmediato si: Rompe la bolsa. Tiene contracciones regulares separadas por menos de 5 minutos. Tiene sangrado o pequeas prdidas vaginales. Siente un dolor abdominal  intenso. Tiene dificultad para respirar. Siente dolor en el pecho. Sufre episodios de Baxter International. No ha sentido a su beb moverse durante el perodo de Sempra Energy indic el mdico. Tiene dolor, hinchazn o enrojecimiento nuevos en un brazo o una pierna o se produce un aumento de alguno de estos sntomas. Resumen El tercer trimestre del Psychiatrist comprende desde la semana 28 hasta la semana 40 (desde el mes 7 hasta el mes 9). Puede tener ms problemas para dormir. Esto puede deberse al tamao de su abdomen, una mayor necesidad de orinar y un aumento en el metabolismo de su cuerpo. Le realizarn exmenes prenatales ms frecuentes durante el tercer trimestre. Cumpla con todas las visitas de seguimiento. Esto es importante. Esta informacin no tiene Theme park manager el consejo del mdico. Asegrese de hacerle al mdico cualquier pregunta que tenga. Document Revised: 06/24/2020 Document Reviewed: 06/24/2020 Elsevier Patient Education  2022  ArvinMeritor.

## 2022-02-19 NOTE — Progress Notes (Signed)
Subjective:  Elaine Green is a 27 y.o. G3P2002 at [redacted]w[redacted]d being seen today for ongoing prenatal care.  She is currently monitored for the following issues for this high-risk pregnancy and has Supervision of high risk pregnancy, antepartum; Rubella non-immune status, antepartum; Maternal morbid obesity, antepartum ([place]); GDM (gestational diabetes mellitus); BMI 40.0-44.9, adult ([place]); Language barrier; and Loss of taste on their problem list.  Patient reports no complaints.  Contractions: Irritability. Vag. Bleeding: None.  Movement: Present. Denies leaking of fluid.   The following portions of the patient's history were reviewed and updated as appropriate: allergies, current medications, past family history, past medical history, past social history, past surgical history and problem list. Problem list updated.  Objective:   Vitals:   02/19/22 1113  BP: 124/69  Pulse: 85  Weight: 240 lb (108.9 kg)    Fetal Status: Fetal Heart Rate (bpm): NST   Movement: Present     General:  Alert, oriented and cooperative. Patient is in no acute distress.  Skin: Skin is warm and dry. No rash noted.   Cardiovascular: Normal heart rate noted  Respiratory: Normal respiratory effort, no problems with respiration noted  Abdomen: Soft, gravid, appropriate for gestational age. Pain/Pressure: Present     Pelvic:  Cervical exam performed        Extremities: Normal range of motion.  Edema: None  Mental Status: Normal mood and affect. Normal behavior. Normal judgment and thought content.   Urinalysis:      Assessment and Plan:  Pregnancy: G3P2002 at [redacted]w[redacted]d  1. Supervision of high risk pregnancy, antepartum Labor precautions - Culture, beta strep (group b only) - GC/Chlamydia probe amp ([place])not at [facility]  2. Gestational diabetes mellitus (GDM) in third trimester controlled on oral hypoglycemic drug Unable to pick up Metformin. CBG's remain elevated Discussed with Dr. Annamaria Boots, MFM will  schedule IOL Sunday/Monday BPP 10/10 today  3. Rubella non-immune status, antepartum Vaccine PP  4. Language barrier Live interpreter used during today's exam  Term labor symptoms and general obstetric precautions including but not limited to vaginal bleeding, contractions, leaking of fluid and fetal movement were reviewed in detail with the patient. Please refer to After Visit Summary for other counseling recommendations.  No follow-ups on file.   Chancy Milroy, MD

## 2022-02-20 ENCOUNTER — Telehealth (HOSPITAL_COMMUNITY): Payer: Self-pay | Admitting: *Deleted

## 2022-02-20 ENCOUNTER — Encounter (HOSPITAL_COMMUNITY): Payer: Self-pay | Admitting: *Deleted

## 2022-02-20 LAB — GC/CHLAMYDIA PROBE AMP (~~LOC~~) NOT AT ARMC
Chlamydia: NEGATIVE
Comment: NEGATIVE
Comment: NORMAL
Neisseria Gonorrhea: NEGATIVE

## 2022-02-20 NOTE — Telephone Encounter (Signed)
Preadmission screen  

## 2022-02-23 LAB — CULTURE, BETA STREP (GROUP B ONLY): Strep Gp B Culture: NEGATIVE

## 2022-02-25 ENCOUNTER — Inpatient Hospital Stay (HOSPITAL_COMMUNITY): Payer: Medicaid Other

## 2022-02-26 ENCOUNTER — Ambulatory Visit (INDEPENDENT_AMBULATORY_CARE_PROVIDER_SITE_OTHER): Payer: Self-pay | Admitting: Obstetrics and Gynecology

## 2022-02-26 ENCOUNTER — Other Ambulatory Visit: Payer: Self-pay

## 2022-02-26 ENCOUNTER — Encounter (HOSPITAL_COMMUNITY): Payer: Self-pay | Admitting: Family Medicine

## 2022-02-26 ENCOUNTER — Other Ambulatory Visit: Payer: Self-pay | Admitting: Obstetrics and Gynecology

## 2022-02-26 ENCOUNTER — Ambulatory Visit: Payer: Self-pay | Admitting: *Deleted

## 2022-02-26 ENCOUNTER — Ambulatory Visit: Payer: Self-pay | Attending: Obstetrics and Gynecology

## 2022-02-26 ENCOUNTER — Other Ambulatory Visit: Payer: Self-pay | Admitting: Obstetrics & Gynecology

## 2022-02-26 ENCOUNTER — Other Ambulatory Visit: Payer: Self-pay | Admitting: Family Medicine

## 2022-02-26 VITALS — BP 116/74 | HR 96 | Wt 245.0 lb

## 2022-02-26 DIAGNOSIS — Z789 Other specified health status: Secondary | ICD-10-CM

## 2022-02-26 DIAGNOSIS — O24415 Gestational diabetes mellitus in pregnancy, controlled by oral hypoglycemic drugs: Secondary | ICD-10-CM

## 2022-02-26 DIAGNOSIS — O09899 Supervision of other high risk pregnancies, unspecified trimester: Secondary | ICD-10-CM

## 2022-02-26 DIAGNOSIS — O0990 Supervision of high risk pregnancy, unspecified, unspecified trimester: Secondary | ICD-10-CM

## 2022-02-26 DIAGNOSIS — Z3A37 37 weeks gestation of pregnancy: Secondary | ICD-10-CM

## 2022-02-26 DIAGNOSIS — Z6841 Body Mass Index (BMI) 40.0 and over, adult: Secondary | ICD-10-CM

## 2022-02-26 DIAGNOSIS — O24410 Gestational diabetes mellitus in pregnancy, diet controlled: Secondary | ICD-10-CM | POA: Insufficient documentation

## 2022-02-26 DIAGNOSIS — Z2839 Other underimmunization status: Secondary | ICD-10-CM

## 2022-02-26 DIAGNOSIS — E669 Obesity, unspecified: Secondary | ICD-10-CM

## 2022-02-26 DIAGNOSIS — O99213 Obesity complicating pregnancy, third trimester: Secondary | ICD-10-CM

## 2022-02-26 DIAGNOSIS — Z758 Other problems related to medical facilities and other health care: Secondary | ICD-10-CM

## 2022-02-26 LAB — SARS CORONAVIRUS 2 (TAT 6-24 HRS): SARS Coronavirus 2: NEGATIVE

## 2022-02-26 NOTE — Progress Notes (Signed)
° °  PRENATAL VISIT NOTE  Subjective:  Elaine Green is a 27 y.o. G3P2002 at [redacted]w[redacted]d being seen today for ongoing prenatal care.  She is currently monitored for the following issues for this high-risk pregnancy and has Supervision of high risk pregnancy, antepartum; Rubella non-immune status, antepartum; Maternal morbid obesity, antepartum (HCC); GDM (gestational diabetes mellitus); BMI 40.0-44.9, adult ([facility]CC); Language barrier; and Loss of taste on their problem list.  Patient doing well with no acute concerns today. She reports no complaints.  Contractions: Irritability. Vag. Bleeding: None.  Movement: Present. Denies leaking of fluid.  Pt is noncompliant with blood sugar monitoring.  She did not bring in her blood sugars today. The following portions of the patient's history were reviewed and updated as appropriate: allergies, current medications, past family history, past medical history, past social history, past surgical history and problem list. Problem list updated.  Objective:   Vitals:   02/26/22 0822  BP: 116/74  Pulse: 96  Weight: 245 lb (111.1 kg)    Fetal Status: Fetal Heart Rate (bpm): 141 Fundal Height: 40 cm Movement: Present     General:  Alert, oriented and cooperative. Patient is in no acute distress.  Skin: Skin is warm and dry. No rash noted.   Cardiovascular: Normal heart rate noted  Respiratory: Normal respiratory effort, no problems with respiration noted  Abdomen: Soft, gravid, appropriate for gestational age.  Pain/Pressure: Present     Pelvic: Cervical exam deferred        Extremities: Normal range of motion.  Edema: None  Mental Status:  Normal mood and affect. Normal behavior. Normal judgment and thought content.   Assessment and Plan:  Pregnancy: G3P2002 at [redacted]w[redacted]d  1. [redacted] weeks gestation of pregnancy   2. Language barrier Video interpreter used  3. BMI 40.0-44.9, adult (HCC)   4. Rubella non-immune status, antepartum Treat after  delivery  5. Supervision of high risk pregnancy, antepartum Pt scheduled for IOL tomorrow  6. Gestational diabetes mellitus (GDM) in third trimester controlled on oral hypoglycemic drug No way to evaluate blood sugar control as she did not bring in her numbers.  Due to noncompliance/lack of understanding pt scheduled for IOL previously.  Term labor symptoms and general obstetric precautions including but not limited to vaginal bleeding, contractions, leaking of fluid and fetal movement were reviewed in detail with the patient.  Please refer to After Visit Summary for other counseling recommendations.   No follow-ups on file.   Mariel Aloe, MD Faculty Attending Center for [facility]

## 2022-02-26 NOTE — Addendum Note (Signed)
Addended by: Guy Begin on: 02/26/2022 08:58 AM   Modules accepted: Orders

## 2022-02-26 NOTE — Procedures (Addendum)
Elaine Green August 03, 1995 [redacted]w[redacted]d  Fetus A Non-Stress Test Interpretation for 02/26/22  Indication: Gestational Diabetes-diet (not taking Metformin) and Obesity  Fetal Heart Rate A Mode: External Baseline Rate (A): 150 bpm Variability: Moderate Accelerations: 15 x 15 Decelerations: None Multiple birth?: No  Uterine Activity Mode: Palpation, Toco Contraction Frequency (min): None Resting Tone Palpated: Relaxed Resting Time: Adequate  Interpretation (Fetal Testing) Nonstress Test Interpretation: Reactive Comments: Dr. Judeth Cornfield reviewed tracing.

## 2022-02-27 ENCOUNTER — Inpatient Hospital Stay (HOSPITAL_COMMUNITY)
Admission: AD | Admit: 2022-02-27 | Discharge: 2022-03-01 | DRG: 807 | Disposition: A | Discharge status: Home / Self Care | Payer: Medicaid Other | Attending: Obstetrics and Gynecology | Admitting: Obstetrics and Gynecology

## 2022-02-27 ENCOUNTER — Encounter (HOSPITAL_COMMUNITY): Payer: Self-pay | Admitting: Obstetrics and Gynecology

## 2022-02-27 ENCOUNTER — Inpatient Hospital Stay (HOSPITAL_COMMUNITY): Payer: Medicaid Other

## 2022-02-27 ENCOUNTER — Other Ambulatory Visit: Payer: Self-pay

## 2022-02-27 DIAGNOSIS — O09899 Supervision of other high risk pregnancies, unspecified trimester: Secondary | ICD-10-CM

## 2022-02-27 DIAGNOSIS — O24425 Gestational diabetes mellitus in childbirth, controlled by oral hypoglycemic drugs: Secondary | ICD-10-CM | POA: Diagnosis not present

## 2022-02-27 DIAGNOSIS — Z3A37 37 weeks gestation of pregnancy: Secondary | ICD-10-CM

## 2022-02-27 DIAGNOSIS — Z3490 Encounter for supervision of normal pregnancy, unspecified, unspecified trimester: Secondary | ICD-10-CM

## 2022-02-27 DIAGNOSIS — O0990 Supervision of high risk pregnancy, unspecified, unspecified trimester: Secondary | ICD-10-CM

## 2022-02-27 DIAGNOSIS — Z6841 Body Mass Index (BMI) 40.0 and over, adult: Secondary | ICD-10-CM

## 2022-02-27 DIAGNOSIS — Z758 Other problems related to medical facilities and other health care: Secondary | ICD-10-CM | POA: Diagnosis present

## 2022-02-27 DIAGNOSIS — O24415 Gestational diabetes mellitus in pregnancy, controlled by oral hypoglycemic drugs: Secondary | ICD-10-CM

## 2022-02-27 DIAGNOSIS — O24429 Gestational diabetes mellitus in childbirth, unspecified control: Secondary | ICD-10-CM | POA: Diagnosis present

## 2022-02-27 DIAGNOSIS — O99214 Obesity complicating childbirth: Secondary | ICD-10-CM | POA: Diagnosis present

## 2022-02-27 DIAGNOSIS — O24419 Gestational diabetes mellitus in pregnancy, unspecified control: Secondary | ICD-10-CM | POA: Diagnosis present

## 2022-02-27 DIAGNOSIS — O99210 Obesity complicating pregnancy, unspecified trimester: Principal | ICD-10-CM

## 2022-02-27 DIAGNOSIS — Z2839 Other underimmunization status: Secondary | ICD-10-CM

## 2022-02-27 DIAGNOSIS — Z789 Other specified health status: Secondary | ICD-10-CM | POA: Diagnosis present

## 2022-02-27 LAB — CBC
HCT: 35.6 % — ABNORMAL LOW (ref 36.0–46.0)
Hemoglobin: 11.9 g/dL — ABNORMAL LOW (ref 12.0–15.0)
MCH: 29.2 pg (ref 26.0–34.0)
MCHC: 33.4 g/dL (ref 30.0–36.0)
MCV: 87.5 fL (ref 80.0–100.0)
Platelets: 182 10*3/uL (ref 150–400)
RBC: 4.07 MIL/uL (ref 3.87–5.11)
RDW: 14.3 % (ref 11.5–15.5)
WBC: 11.9 10*3/uL — ABNORMAL HIGH (ref 4.0–10.5)
nRBC: 0 % (ref 0.0–0.2)

## 2022-02-27 LAB — TYPE AND SCREEN
ABO/RH(D): O POS
Antibody Screen: NEGATIVE

## 2022-02-27 LAB — GLUCOSE, CAPILLARY
Glucose-Capillary: 87 mg/dL (ref 70–99)
Glucose-Capillary: 102 mg/dL — ABNORMAL HIGH (ref 70–99)

## 2022-02-27 MED ORDER — LACTATED RINGERS IV SOLN: 500.0000 mL | Freq: Once | INTRAVENOUS | Status: DC

## 2022-02-27 MED ORDER — EPHEDRINE 5 MG/ML INJ: 10.0000 mg | INTRAVENOUS | Status: DC | PRN

## 2022-02-27 MED ORDER — ONDANSETRON HCL 4 MG/2ML IJ SOLN
4.0000 mg | Freq: Four times a day (QID) | INTRAMUSCULAR | Status: DC | PRN
Start: 2022-02-27 — End: 2022-02-28

## 2022-02-27 MED ORDER — OXYTOCIN-SODIUM CHLORIDE 30-0.9 UT/500ML-% IV SOLN: 1.0000 m[IU]/min | INTRAVENOUS | Status: DC

## 2022-02-27 MED ORDER — TERBUTALINE SULFATE 1 MG/ML IJ SOLN: 0.2500 mg | Freq: Once | INTRAMUSCULAR | Status: DC | PRN

## 2022-02-27 MED ORDER — OXYCODONE-ACETAMINOPHEN 5-325 MG PO TABS: 1.0000 | ORAL_TABLET | ORAL | Status: DC | PRN

## 2022-02-27 MED ORDER — OXYTOCIN-SODIUM CHLORIDE 30-0.9 UT/500ML-% IV SOLN
1.0000 m[IU]/min | INTRAVENOUS | Status: DC
  Administered 2022-02-27: 2 m[IU]/min via INTRAVENOUS
  Filled 2022-02-27: qty 500

## 2022-02-27 MED ORDER — PHENYLEPHRINE 40 MCG/ML (10ML) SYRINGE FOR IV PUSH (FOR BLOOD PRESSURE SUPPORT): 80.0000 ug | PREFILLED_SYRINGE | INTRAVENOUS | Status: DC | PRN

## 2022-02-27 MED ORDER — LACTATED RINGERS IV SOLN: 500.0000 mL | INTRAVENOUS | Status: DC | PRN

## 2022-02-27 MED ORDER — DIPHENHYDRAMINE HCL 50 MG/ML IJ SOLN: 12.5000 mg | INTRAMUSCULAR | Status: DC | PRN

## 2022-02-27 MED ORDER — ACETAMINOPHEN 325 MG PO TABS: 650.0000 mg | ORAL_TABLET | ORAL | Status: DC | PRN

## 2022-02-27 MED ORDER — OXYTOCIN BOLUS FROM INFUSION
333.0000 mL | Freq: Once | INTRAVENOUS | Status: AC
  Administered 2022-02-28: 333 mL via INTRAVENOUS

## 2022-02-27 MED ORDER — OXYCODONE-ACETAMINOPHEN 5-325 MG PO TABS
2.0000 | ORAL_TABLET | ORAL | Status: DC | PRN
  Administered 2022-02-28: 2 via ORAL
  Filled 2022-02-27: qty 2

## 2022-02-27 MED ORDER — LIDOCAINE HCL (PF) 1 % IJ SOLN
30.0000 mL | INTRAMUSCULAR | Status: AC | PRN
  Administered 2022-02-28: 30 mL via SUBCUTANEOUS
  Filled 2022-02-27: qty 30

## 2022-02-27 MED ORDER — LACTATED RINGERS IV SOLN: INTRAVENOUS | Status: DC

## 2022-02-27 MED ORDER — FENTANYL-BUPIVACAINE-NACL 0.5-0.125-0.9 MG/250ML-% EP SOLN: 12.0000 mL/h | EPIDURAL | Status: DC | PRN

## 2022-02-27 MED ORDER — FENTANYL CITRATE (PF) 100 MCG/2ML IJ SOLN: 50.0000 ug | INTRAMUSCULAR | Status: DC | PRN

## 2022-02-27 MED ORDER — OXYTOCIN-SODIUM CHLORIDE 30-0.9 UT/500ML-% IV SOLN
2.5000 [IU]/h | INTRAVENOUS | Status: DC
  Administered 2022-02-28: 2.5 [IU]/h via INTRAVENOUS

## 2022-02-27 MED ORDER — SOD CITRATE-CITRIC ACID 500-334 MG/5ML PO SOLN: 30.0000 mL | ORAL | Status: DC | PRN

## 2022-02-27 MED ORDER — MISOPROSTOL 50MCG HALF TABLET
50.0000 ug | ORAL_TABLET | ORAL | Status: DC | PRN
  Administered 2022-02-27: 50 ug via BUCCAL
  Filled 2022-02-27: qty 1

## 2022-02-27 NOTE — H&P (Addendum)
OBSTETRIC ADMISSION HISTORY AND PHYSICAL  Elaine Green is a 27 y.o. female G3P2002 with IUP at 32w6dby 8-wk [place] Patient reports that she has not started Metformin as prescribed. She intermittently checks her sugars. Most recently, she states that were elevated (110s fasting and 130s post-prandial). She reports +FMs, no LOF, no VB, no blurry vision, headaches, peripheral edema, or RUQ pain.  She plans on breast and bottle feeding. She declines birth control for contraception postpartum.  [facility]  Dating: By 8-wk [place]--->  Estimated Date of Delivery: 03/14/22  Sono:   '@[redacted]w[redacted]d' , normal anatomy, cephalic presentation, fundal placental lie, 2760 g, 51% EFW  Prenatal History/Complications:  -AL4YTK-Rubella non-immune -Elevated BMI (43) -Language barrier (Surveyor, quantity  Past Medical History: Past Medical History:  Diagnosis Date   Gestational diabetes     Past Surgical History: Past Surgical History:  Procedure Laterality Date   NO PAST SURGERIES      Obstetrical History: OB History     Gravida  3   Para  2   Term  2   Preterm  0   AB  0   Living  2      SAB  0   IAB  0   Ectopic  0   Multiple  0   Live Births  2           Social History Social History   Socioeconomic History   Marital status: Single    Spouse name: Not on file   Number of children: Not on file   Years of education: Not on file   Highest education level: Not on file  Occupational History   Not on file  Tobacco Use   Smoking status: Never   Smokeless tobacco: Never  Vaping Use   Vaping Use: Never used  Substance and Sexual Activity   Alcohol use: No   Drug use: No   Sexual activity: Yes    Birth control/protection: None  Other Topics Concern   Not on file  Social History Narrative   Not on file   Social Determinants of Health   Financial Resource Strain: Not on file  Food Insecurity: No Food  Insecurity   Worried About Running Out of Food in the Last Year: Never true   Ran Out of Food in the Last Year: Never true  Transportation Needs: No Transportation Needs   Lack of Transportation (Medical): No   Lack of Transportation (Non-Medical): No  Physical Activity: Not on file  Stress: Not on file  Social Connections: Not on file    Family History: Family History  Problem Relation Age of Onset   Cancer Neg Hx    Diabetes Neg Hx    Hypertension Neg Hx     Allergies: No Known Allergies  Medications Prior to Admission  Medication Sig Dispense Refill Last Dose   metFORMIN (GLUCOPHAGE) 500 MG tablet Take 1 tablet (500 mg total) by mouth 2 (two) times daily with a meal. (Patient not taking: Reported on 02/19/2022) 60 tablet 5    Prenatal Vit-Fe Fumarate-FA (MULTIVITAMIN-PRENATAL) 27-0.8 MG TABS tablet Take 1 tablet by mouth daily at 12 noon.        Review of Systems  All systems reviewed and negative except as stated in HPI  Blood pressure 107/62, pulse 94, temperature 98 F (36.7 C), temperature source Oral, resp. rate 16, height '5\' 3"'  (1.6 m), weight 111.1 kg, last  menstrual period 06/13/2021, unknown if currently breastfeeding.  General appearance: alert, cooperative, and no distress Lungs: normal work of breathing on room air Heart: normal rate, warm and well-perfused Abdomen: soft, non-tender; gravid Extremities: no LE edema, no calf tenderness to palpation  Presentation: Cephalic per RN Fetal monitoring: Baseline FHR 140 bpm/moderate variability/+accels, no decels Uterine activity: None Dilation: Closed Effacement (%): 0 Station: -2 Exam by:: lee  Prenatal labs: ABO, Rh: --/--/O POS (02/28 1643) Antibody: NEG (02/28 1643) Rubella: <0.90 (08/31 1615) RPR: Non Reactive (12/21 0843)  HBsAg: Negative (08/31 1615)  HIV: Non Reactive (12/21 0843)  GBS: Negative/-- (02/20 1150)  2 hr Glucola: Abnormal  Genetic screening: LR NIPS Anatomy US: Normal  Prenatal  Transfer Tool  Maternal Diabetes: Yes:  Diabetes Type:  Insulin/Medication controlled Genetic Screening: Normal Maternal Ultrasounds/Referrals: Normal Fetal Ultrasounds or other Referrals:  None Maternal Substance Abuse:  No Significant Maternal Medications:  Meds include: Other: Metformin, not taking Significant Maternal Lab Results: Group B Strep negative  Results for orders placed or performed during the hospital encounter of 02/27/22 (from the past 24 hour(s))  CBC   Collection Time: 02/27/22  4:43 PM  Result Value Ref Range   WBC 11.9 (H) 4.0 - 10.5 K/uL   RBC 4.07 3.87 - 5.11 MIL/uL   Hemoglobin 11.9 (L) 12.0 - 15.0 g/dL   HCT 35.6 (L) 36.0 - 46.0 %   MCV 87.5 80.0 - 100.0 fL   MCH 29.2 26.0 - 34.0 pg   MCHC 33.4 30.0 - 36.0 g/dL   RDW 14.3 11.5 - 15.5 %   Platelets 182 150 - 400 K/uL   nRBC 0.0 0.0 - 0.2 %  Type and screen   Collection Time: 02/27/22  4:43 PM  Result Value Ref Range   ABO/RH(D) PENDING    Antibody Screen PENDING    Sample Expiration      03/02/2022,2359 Performed at [facility], [place] [address], [place], [place] [address]     Patient Active Problem List   Diagnosis Date Noted   Gestational diabetes 02/27/2022   BMI 40.0-44.9, adult ([place]) 01/17/2022   Language barrier 01/17/2022   Loss of taste 01/17/2022   GDM (gestational diabetes mellitus) 12/28/2021   Maternal morbid obesity, antepartum ([place]) 11/09/2021   Rubella non-immune status, antepartum 08/31/2021   Supervision of high risk pregnancy, antepartum 08/22/2021    Assessment/Plan:  Elaine Green is a 27 y.o. G3P2002 at 60w6dhere for IOL due to A2GDM.  #Labor: Plan to initiate induction with buccal cytotec for cervical ripening. Will consider foley balloon placement when able. Plan to reassess in 4 hours.  #Pain: PRN #FWB: Cat 1 #ID: GBS negative #MOF: Both #MOC: None #Circ: No  #A2GDM: Not taking home Metformin. Not checking glucose regularly but reports FBG  ~117, postprandial 130s. EFW 2760 g, 51% at 376w2dProven pelvis 3365 g at 3934w4dBG on admission 87. Will monitor q4hr glucose checks while in latent labor.   #Rubella Non-immune: Will offer MMR postpartum  [name] PGY-1 02/27/2022, 5:39 PM  GME ATTESTATION:  I saw and evaluated the patient. I agree with the findings and the plan of care as documented in the residents note. I have made changes to documentation as necessary.  ChrVilma MeckelD OB Fellow, FacSalemr [facility]om[facility]ake Hamlet28/2023 7:15 PM

## 2022-02-28 ENCOUNTER — Encounter (HOSPITAL_COMMUNITY): Payer: Self-pay | Admitting: Obstetrics and Gynecology

## 2022-02-28 DIAGNOSIS — O24419 Gestational diabetes mellitus in pregnancy, unspecified control: Secondary | ICD-10-CM

## 2022-02-28 DIAGNOSIS — Z3A37 37 weeks gestation of pregnancy: Secondary | ICD-10-CM

## 2022-02-28 DIAGNOSIS — O24425 Gestational diabetes mellitus in childbirth, controlled by oral hypoglycemic drugs: Secondary | ICD-10-CM

## 2022-02-28 LAB — GLUCOSE, CAPILLARY: Glucose-Capillary: 96 mg/dL (ref 70–99)

## 2022-02-28 LAB — RPR: RPR Ser Ql: NONREACTIVE

## 2022-02-28 MED ORDER — COCONUT OIL OIL: 1.0000 "application " | TOPICAL_OIL | Status: DC | PRN

## 2022-02-28 MED ORDER — ONDANSETRON HCL 4 MG PO TABS: 4.0000 mg | ORAL_TABLET | ORAL | Status: DC | PRN

## 2022-02-28 MED ORDER — ONDANSETRON HCL 4 MG/2ML IJ SOLN: 4.0000 mg | INTRAMUSCULAR | Status: DC | PRN

## 2022-02-28 MED ORDER — ZOLPIDEM TARTRATE 5 MG PO TABS: 5.0000 mg | ORAL_TABLET | Freq: Every evening | ORAL | Status: DC | PRN

## 2022-02-28 MED ORDER — ACETAMINOPHEN 325 MG PO TABS
650.0000 mg | ORAL_TABLET | ORAL | Status: DC | PRN
  Administered 2022-02-28: 650 mg via ORAL
  Filled 2022-02-28: qty 2

## 2022-02-28 MED ORDER — BENZOCAINE-MENTHOL 20-0.5 % EX AERO
1.0000 | INHALATION_SPRAY | CUTANEOUS | Status: DC | PRN
Start: 2022-02-28 — End: 2022-03-01
  Administered 2022-02-28: 1 via TOPICAL
  Filled 2022-02-28: qty 56

## 2022-02-28 MED ORDER — SODIUM CHLORIDE 0.9% FLUSH: 3.0000 mL | Freq: Two times a day (BID) | INTRAVENOUS | Status: DC

## 2022-02-28 MED ORDER — SODIUM CHLORIDE 0.9% FLUSH: 3.0000 mL | INTRAVENOUS | Status: DC | PRN

## 2022-02-28 MED ORDER — SODIUM CHLORIDE 0.9 % IV SOLN: 250.0000 mL | INTRAVENOUS | Status: DC | PRN

## 2022-02-28 MED ORDER — DIPHENHYDRAMINE HCL 25 MG PO CAPS: 25.0000 mg | ORAL_CAPSULE | Freq: Four times a day (QID) | ORAL | Status: DC | PRN

## 2022-02-28 MED ORDER — DIBUCAINE (PERIANAL) 1 % EX OINT: 1.0000 "application " | TOPICAL_OINTMENT | CUTANEOUS | Status: DC | PRN

## 2022-02-28 MED ORDER — SIMETHICONE 80 MG PO CHEW: 80.0000 mg | CHEWABLE_TABLET | ORAL | Status: DC | PRN

## 2022-02-28 MED ORDER — WITCH HAZEL-GLYCERIN EX PADS: 1.0000 "application " | MEDICATED_PAD | CUTANEOUS | Status: DC | PRN

## 2022-02-28 MED ORDER — MEASLES, MUMPS & RUBELLA VAC IJ SOLR: 0.5000 mL | Freq: Once | INTRAMUSCULAR | Status: DC

## 2022-02-28 MED ORDER — TETANUS-DIPHTH-ACELL PERTUSSIS 5-2.5-18.5 LF-MCG/0.5 IM SUSY: 0.5000 mL | PREFILLED_SYRINGE | Freq: Once | INTRAMUSCULAR | Status: DC

## 2022-02-28 MED ORDER — PRENATAL MULTIVITAMIN CH
1.0000 | ORAL_TABLET | Freq: Every day | ORAL | Status: DC
  Administered 2022-02-28 – 2022-03-01 (×2): 1 via ORAL
  Filled 2022-02-28: qty 1

## 2022-02-28 MED ORDER — SENNOSIDES-DOCUSATE SODIUM 8.6-50 MG PO TABS
2.0000 | ORAL_TABLET | ORAL | Status: DC
  Administered 2022-02-28 – 2022-03-01 (×2): 2 via ORAL
  Filled 2022-02-28 (×2): qty 2

## 2022-02-28 MED ORDER — IBUPROFEN 600 MG PO TABS
600.0000 mg | ORAL_TABLET | Freq: Four times a day (QID) | ORAL | Status: DC
  Administered 2022-02-28 – 2022-03-01 (×5): 600 mg via ORAL
  Filled 2022-02-28 (×5): qty 1

## 2022-02-28 NOTE — Progress Notes (Signed)
Labor Progress Note ?Sorayah Mackenzie Groom is a 27 y.o. G3P2002 at [redacted]w[redacted]d presented for IOL due to A2GDM.  ? ?S: Doing well. Feeling some ctx.  ? ?Spanish interpreter used.  ? ?O:  ?BP 129/75   Pulse 94   Temp 98.2 ?F (36.8 ?C) (Oral)   Resp 15   Ht 5\' 3"  (1.6 m)   Wt 111.1 kg   LMP 06/13/2021 (Exact Date)   BMI 43.40 kg/m?  ?EFM: 145/mod/15x15/none ? ?CVE: Dilation: 4 ?Effacement (%): 70 ?Cervical Position: Middle ?Station: -3 ?Presentation: Vertex ?Exam by:: Dr. [email or web address] ? ? ?A&P: 27 y.o. G3P2002 [redacted]w[redacted]d  ?#Labor: Progressing well. Head well applied. After verbal consent, performed AROM with moderate amount of clear fluid. Head remained well applied to cervix after ROM.  ?#Pain: PRN ?#FWB: Cat I  ?#GBS negative ? ?#A2GDM: CBGs stable. Q4 CBGs.  ? ?[redacted]w[redacted]d, DO ?1:37 AM  ?

## 2022-02-28 NOTE — Discharge Summary (Signed)
? ?  Postpartum Discharge Summary ? ?   ?Patient Name: Elaine Green ?DOB: 04-Apr-1995 ?MRN: [id number] ? ?Date of admission: 02/27/2022 ?Delivery date:02/28/2022  ?Delivering provider: Patriciaann Clan  ?Date of discharge: 03/01/2022 ? ?Admitting diagnosis: Gestational diabetes [O24.419] ?Intrauterine pregnancy: [redacted]w[redacted]d    ?Secondary diagnosis:  Principal Problem: ?  Gestational diabetes ?Active Problems: ?  Rubella non-immune status, antepartum ?  BMI 40.0-44.9, adult ([place] ?  Language barrier ? ?Additional problems: none    ?Discharge diagnosis: Term Pregnancy Delivered and GDM A2                                              ?Post partum procedures: offered MMR vaccine ?Augmentation: AROM, Pitocin, and Cytotec ?Complications: None ? ?Hospital course: Induction of Labor With Vaginal Delivery   ?27y.o. yo G3P3003 at 354w0das admitted to the hospital 02/27/2022 for induction of labor.  Indication for induction: A2 DM.  Patient had an uncomplicated labor course as follows: ?Membrane Rupture Time/Date: 1:30 AM ,02/28/2022   ?Delivery Method:Vaginal, Spontaneous  ?Episiotomy: None  ?Lacerations:  1st degree;Perineal  ?Details of delivery can be found in separate delivery note.  Patient had a routine postpartum course. Her fasting CBG on PPD#1 was 100. Patient is discharged home 03/01/22 per her request for early d/c as long as the baby can go as well. ? ?Newborn Data: ?Birth date:02/28/2022  ?Birth time:3:49 AM  ?Gender:Female  ?Living status:Living  ?Apgars:8 ,9  ?Weight:3330 g (7lb 5.5oz) ? ?Magnesium Sulfate received: No ?BMZ received: No ?Rhophylac:No ?MMR:Yes (recommended- unsure if she elected to get it) ?T-DaP:Given prenatally ?Flu: No ?Transfusion:No ? ?Physical exam  ?Vitals:  ? 02/28/22 1000 02/28/22 1435 02/28/22 1819 03/01/22 0637  ?BP: 123/78 111/66 112/65 101/62  ?Pulse: 99 84 70 68  ?Resp: '20 18 20 18  ' ?Temp: 98.9 ?F (37.2 ?C) 98.5 ?F (36.9 ?C) 99.1 ?F (37.3 ?C) 98.1 ?F (36.7 ?C)  ?TempSrc: Oral Oral  Oral Oral  ?SpO2: 99% 99%  98%  ?Weight:      ?Height:      ? ?General: alert and cooperative ?Lochia: appropriate ?Uterine Fundus: firm ?Incision: N/A ?DVT Evaluation: No evidence of DVT seen on physical exam. ?Labs: ?Lab Results  ?Component Value Date  ? WBC 11.9 (H) 02/27/2022  ? HGB 11.9 (L) 02/27/2022  ? HCT 35.6 (L) 02/27/2022  ? MCV 87.5 02/27/2022  ? PLT 182 02/27/2022  ? ?No flowsheet data found. ?Edinburgh Score: ?Edinburgh Postnatal Depression Scale Screening Tool 02/28/2022  ?I have been able to laugh and see the funny side of things. 0  ?I have looked forward with enjoyment to things. 0  ?I have blamed myself unnecessarily when things went wrong. 0  ?I have been anxious or worried for no good reason. 0  ?I have felt scared or panicky for no good reason. 0  ?Things have been getting on top of me. 1  ?I have been so unhappy that I have had difficulty sleeping. 0  ?I have felt sad or miserable. 0  ?I have been so unhappy that I have been crying. 0  ?The thought of harming myself has occurred to me. 0  ?Edinburgh Postnatal Depression Scale Total 1  ? ? ? ?After visit meds:  ?Allergies as of 03/01/2022   ?No Known Allergies ?  ? ?  ?Medication List  ?  ? ?  STOP taking these medications   ? ?metFORMIN 500 MG tablet ?Commonly known as: GLUCOPHAGE ?  ? ?  ? ?TAKE these medications   ? ?ibuprofen 600 MG tablet ?Commonly known as: ADVIL ?Take 1 tablet (600 mg total) by mouth every 6 (six) hours as needed. ?  ?multivitamin-prenatal 27-0.8 MG Tabs tablet ?Take 1 tablet by mouth daily at 12 noon. ?  ? ?  ? ? ? ?Discharge home in stable condition ?Infant Feeding: Bottle and Breast ?Infant Disposition:home with mother ?Discharge instruction: per After Visit Summary and Postpartum booklet. ?Activity: Advance as tolerated. Pelvic rest for 6 weeks.  ?Diet: routine diet ?Future Appointments: ?Future Appointments  ?Date Time Provider [place]  ?04/12/2022  8:50 AM [facility]  ?04/12/2022  9:15 AM Radene Gunning, MD [facility]  ? ?Follow up Visit: ? ?Message sent to [facility] by Dr Higinio Plan:  ?Please schedule this patient for a In person postpartum visit in 6 weeks with the following provider: Any provider. ?Additional Postpartum F/U:2 hour GTT  ?High risk pregnancy complicated by: GDM ?Delivery mode:  Vaginal, Spontaneous  ?Anticipated Birth Control:  Condoms ? ? ?03/01/2022 ?Myrtis Ser, CNM ?8:55 AM ? ? ? ? ?

## 2022-02-28 NOTE — Lactation Note (Signed)
This note was copied from a baby's chart. ?Lactation Consultation Note ? ?Patient Name: Elaine Green ?Today's Date: 02/28/2022 ?Reason for consult: Initial assessment ?Age:26 hours ? ?Spanish interpreter used via video. ?P3, Ex BF.  Reviewed hand expression with good flow. ?Encouraged breastfeeding before offering formula to help establish milk supply.  Feed on demand with cues.  Goal 8-12+ times per day after first 24 hrs.  Place baby STS if not cueing.  ?Mom made aware of O/P services, breastfeeding support group,  and our phone # for post-discharge questions.  ? ? ? ?Maternal Data ?Has patient been taught Hand Expression?: Yes ?Does the patient have breastfeeding experience prior to this delivery?: Yes ?How long did the patient breastfeed?: 16 mos and 9 mos. ? ?Feeding ?Mother's Current Feeding Choice: Breast Milk and Formula ?Nipple Type: Slow - flow ? ?LATCH Score ?Latch: Grasps breast easily, tongue down, lips flanged, rhythmical sucking. ? ?Audible Swallowing: Spontaneous and intermittent ? ?Type of Nipple: Everted at rest and after stimulation ? ?Comfort (Breast/Nipple): Soft / non-tender ? ?Hold (Positioning): Assistance needed to correctly position infant at breast and maintain latch. ? ?LATCH Score: 9 ? ? ?Interventions ?Interventions: Hand express;Education;Breast feeding basics reviewed ? ?Consult Status ?Consult Status: Follow-up ?Date: 03/01/22 ?Follow-up type: In-patient ? ? ? ?Dahlia Byes Boschen ?02/28/2022, 8:48 AM ? ? ? ?

## 2022-03-01 LAB — GLUCOSE, CAPILLARY: Glucose-Capillary: 100 mg/dL — ABNORMAL HIGH (ref 70–99)

## 2022-03-01 MED ORDER — IBUPROFEN 600 MG PO TABS
600.0000 mg | ORAL_TABLET | Freq: Four times a day (QID) | ORAL | 0 refills | Status: DC | PRN
Start: 2022-03-01 — End: 2024-11-17

## 2022-03-01 NOTE — Progress Notes (Signed)
Pt. Declined use of interpretor for discharge verbalized understanding for paper work ?

## 2022-03-01 NOTE — Progress Notes (Signed)
Discussed MMR vaccine with pt gave VIS english and spanish pt declined to receive.  ?

## 2022-03-01 NOTE — Lactation Note (Signed)
This note was copied from a baby's chart. ?Lactation Consultation Note ? ?Patient Name: Elaine Green ?Today's Date: 03/01/2022 ?Reason for consult: Follow-up assessment;Early term 37-38.6wks;Infant weight loss;Other (Comment) (per mom recently fed at 1130. LC reviewed and updated the doc flow sheets. LC reviewed Supply and demand/ importance of offering the breast 1st. LC reviewed BF D/C teaching. LC provided a hand pump.) ?Age:26 hours ? ?Maternal Data ?  ? ?Feeding ?Mother's Current Feeding Choice: Breast Milk and Formula ?Nipple Type: Slow - flow ? ?LATCH Score ?  ? ?  ? ?  ? ?  ? ?  ? ?  ? ? ?Lactation Tools Discussed/Used ?Tools: Pump;Flanges ?Flange Size: 24;27 ?Breast pump type: Manual ?Pump Education: Milk Storage ?Reason for Pumping: PRN ? ?Interventions ?Interventions: Breast feeding basics reviewed;Hand pump;Education;LC Services brochure ? ?Discharge ?Discharge Education: Engorgement and breast care;Warning signs for feeding baby ?Pump: Manual ? ?Consult Status ?Consult Status: Complete ?Date: 03/01/22 ? ? ? ?Matilde Sprang Alandra Sando ?03/01/2022, 12:04 PM ? ? ? ?

## 2022-03-10 ENCOUNTER — Telehealth (HOSPITAL_COMMUNITY): Payer: Self-pay

## 2022-03-10 NOTE — Telephone Encounter (Addendum)
"  I'm doing good." Patient declines questions or concerns about her healing. ? ?"Baby is doing good. We had an appointment with the pediatrician yesterday and they told me he was gaining weight. Baby sleeps in a bassinet." RN reviewed ABC's of safe sleep with patient. Patient declines any questions or concerns about baby. ? ?EPDS score is 0. ? ?[name],RN3,MSN,RNC-MNN ?03/10/2022,1359 ?

## 2022-03-21 ENCOUNTER — Ambulatory Visit: Payer: Self-pay | Admitting: Obstetrics and Gynecology

## 2022-04-02 ENCOUNTER — Other Ambulatory Visit: Payer: Self-pay

## 2022-04-02 DIAGNOSIS — O24415 Gestational diabetes mellitus in pregnancy, controlled by oral hypoglycemic drugs: Secondary | ICD-10-CM

## 2022-04-12 ENCOUNTER — Other Ambulatory Visit: Payer: Self-pay

## 2022-04-12 ENCOUNTER — Ambulatory Visit (INDEPENDENT_AMBULATORY_CARE_PROVIDER_SITE_OTHER): Payer: Self-pay | Admitting: Obstetrics and Gynecology

## 2022-04-12 ENCOUNTER — Encounter: Payer: Self-pay | Admitting: Obstetrics and Gynecology

## 2022-04-12 NOTE — Progress Notes (Signed)
? ? ?Post Partum Visit Note ? ?Elaine Green is a 27 y.o. [phone] female who presents for a postpartum visit. She is 6 weeks postpartum following a normal spontaneous vaginal delivery.  I have fully reviewed the prenatal and intrapartum course. The delivery was at [redacted]w[redacted]d gestational weeks.  Anesthesia: local. Postpartum course has been uncomplicated. Baby is doing well. Baby is feeding by breast. Bleeding no bleeding. Bowel function is normal. Bladder function is normal. Patient is not sexually active. Contraception method is abstinence. Postpartum depression screening: negative. ? ? ?The pregnancy intention screening data noted above was reviewed. Potential methods of contraception were discussed. The patient elected to proceed with No data recorded. ? ? Edinburgh Postnatal Depression Scale - 04/12/22 0917   ? ?  ? Edinburgh Postnatal Depression Scale:  In the Past 7 Days  ? I have been able to laugh and see the funny side of things. 0   ? I have looked forward with enjoyment to things. 0   ? I have blamed myself unnecessarily when things went wrong. 0   ? I have been anxious or worried for no good reason. 0   ? I have felt scared or panicky for no good reason. 0   ? Things have been getting on top of me. 0   ? I have been so unhappy that I have had difficulty sleeping. 0   ? I have felt sad or miserable. 0   ? I have been so unhappy that I have been crying. 0   ? The thought of harming myself has occurred to me. 0   ? Edinburgh Postnatal Depression Scale Total 0   ? ?  ?  ? ?  ? ? ?Health Maintenance Due  ?Topic Date Due  ? COVID-19 Vaccine (1) Never done  ? PAP-Cervical Cytology Screening  Never done  ? PAP SMEAR-Modifier  Never done  ? ? ?The following portions of the patient's history were reviewed and updated as appropriate: allergies, current medications, past family history, past medical history, past social history, past surgical history, and problem list. ? ?Review of Systems ?Pertinent  items are noted in HPI. ? ?Objective:  ?BP 112/79   Pulse 89   Wt 235 lb 8 oz (106.8 kg)   Breastfeeding Yes   BMI 41.72 kg/m?   ? ?General:  alert, cooperative, and no distress  ? Breasts:  not indicated  ?Lungs: clear to auscultation bilaterally  ?Heart:  regular rate and rhythm, S1, S2 normal, no murmur, click, rub or gallop  ?Abdomen: soft, non-tender; bowel sounds normal; no masses,  no organomegaly   ?Wound N/a  ?GU exam:  not indicated  ?     ?Assessment:  ? ? There are no diagnoses linked to this encounter. ? ?6w postpartum exam.  ? ?Plan:  ? ?Essential components of care per ACOG recommendations: ? ?1.  Mood and well being: Patient with negative depression screening today. Reviewed local resources for support.  ?- Patient tobacco use? No.   ?- hx of drug use? No.   ? ?2. Infant care and feeding:  ?-Patient currently breastmilk feeding? Yes. Reviewed importance of draining breast regularly to support lactation.  ?-Social determinants of health (SDOH) reviewed in EPIC. No concerns ? ?3. Sexuality, contraception and birth spacing ?- Patient does not want a pregnancy in the next year.  Desired family size is 3 children.  ?- Reviewed reproductive life planning. Reviewed contraceptive methods based on pt preferences and effectiveness.  Patient  desired Female Condom today.   ?- Discussed birth spacing of 18 months ? ?4. Sleep and fatigue ?-Encouraged family/partner/community support of 4 hrs of uninterrupted sleep to help with mood and fatigue ? ?5. Physical Recovery  ?- Discussed patients delivery and complications. She describes her labor as good. ?- Patient had a Vaginal, no problems at delivery. Patient had a 1st degree laceration. Perineal healing reviewed. Patient expressed understanding ?- Patient has urinary incontinence? No. ?- Patient is safe to resume physical and sexual activity ? ?6.  Health Maintenance ?- HM due items addressed Yes ?- Last pap smear No results found for: DIAGPAP Pap smear not done  at today's visit.  ?-Breast Cancer screening indicated? No.  ? ?7. Chronic Disease/Pregnancy Condition follow up: Gestational Diabetes - has appt on 4/19 for 2 hr ? ?- PCP follow up ? ?Milas Hock, MD ?[facility] for [facility], [facility]  ?

## 2022-04-18 ENCOUNTER — Other Ambulatory Visit: Payer: Self-pay

## 2022-04-18 DIAGNOSIS — O24415 Gestational diabetes mellitus in pregnancy, controlled by oral hypoglycemic drugs: Secondary | ICD-10-CM

## 2022-04-19 LAB — GLUCOSE TOLERANCE, 2 HOURS
Glucose, 2 hour: 76 mg/dL (ref 70–139)
Glucose, GTT - Fasting: 93 mg/dL (ref 70–99)

## 2022-04-23 ENCOUNTER — Telehealth: Payer: Self-pay | Admitting: General Practice

## 2022-04-23 NOTE — Telephone Encounter (Signed)
Called patient with Elaine Green assisting with spanish interpretation and informed her of normal gtt results. Patient verbalized understanding and asked what she could take for allergies as she is breastfeeding. Told patient she could take zyrtec or claritin but it could possibly lower her supply so if she notices that she should stop taking it. Also suggested flonase which wouldn't have an issue with her supply. Patient verbalized understanding. ?

## 2022-04-23 NOTE — Telephone Encounter (Signed)
-----   Message from Milas Hock, MD sent at 04/23/2022  8:03 AM EDT ----- ?Please let her know her follow up testing for GDM shows it resolved - good news!  ?Thanks, pad ?

## 2022-06-26 ENCOUNTER — Ambulatory Visit: Payer: Self-pay

## 2023-01-11 IMAGING — US US OB COMP LESS 14 WK
1 series · 15 of 21 positions shown · non-contrast
Comparison: None.

CLINICAL DATA: Abdominal pain. Quantitative beta HCG is pending.
Uncertain LMP was 06/13/2021. EDC by LMP would be 03/20/2022.

EXAM:
OBSTETRIC <14 WK ULTRASOUND
TECHNIQUE: Transabdominal ultrasound was performed for evaluation of the
gestation as well as the maternal uterus and adnexal regions.

[Series 1: us ob comp less 14 wk · 21 acquisitions, 15 frames shown]
[im 1/21]
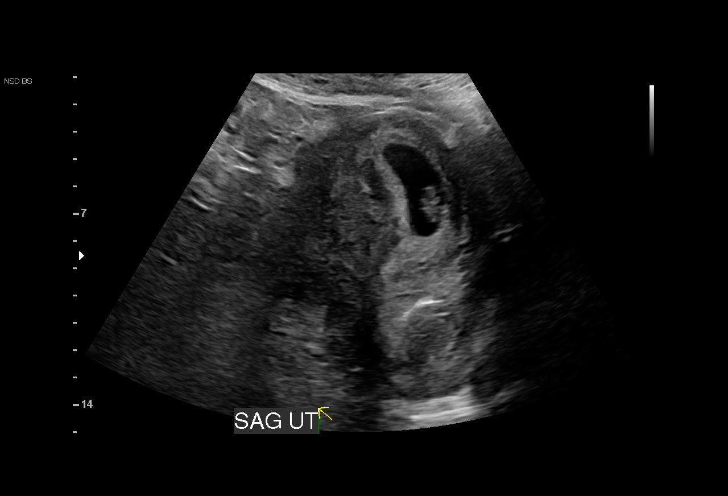
[im 3/21]
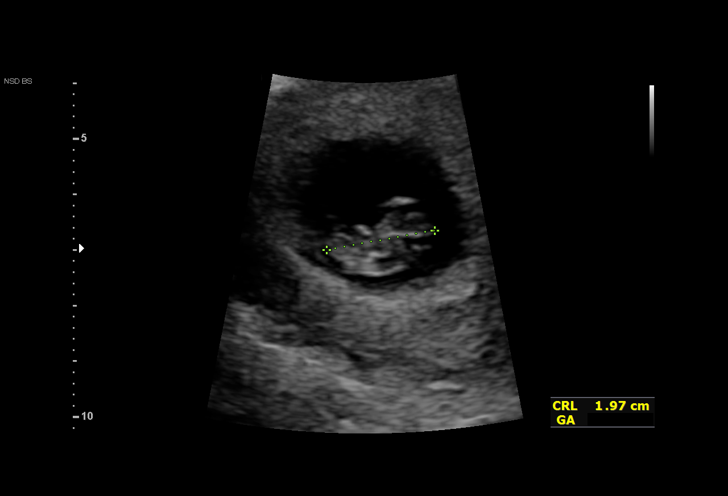
[im 4/21]
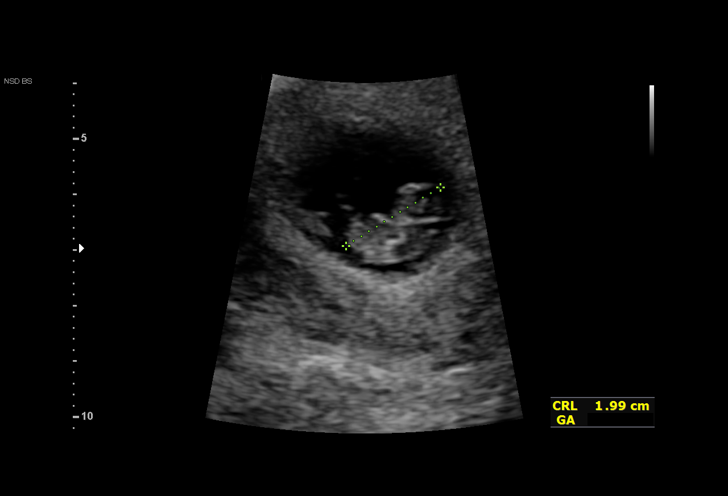
[im 5/21]
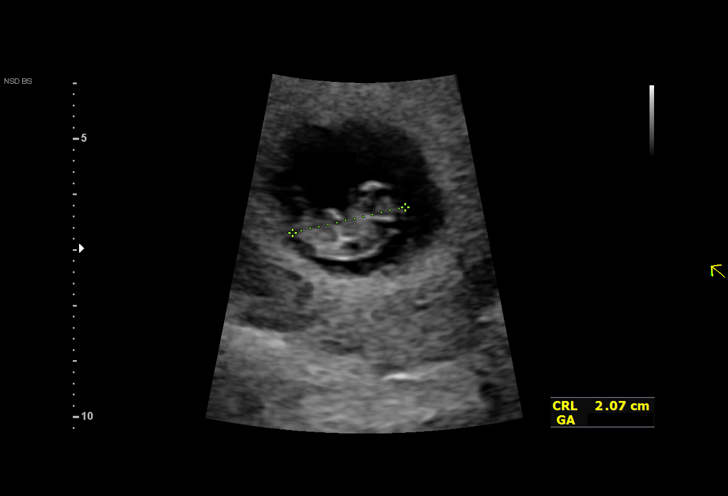
[im 7/21]
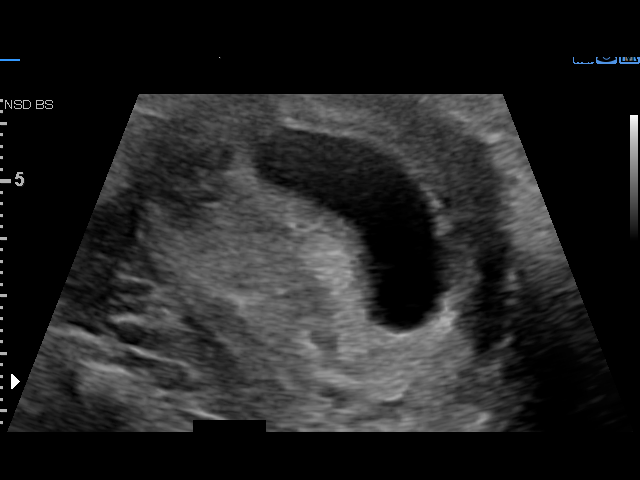
[im 8/21]
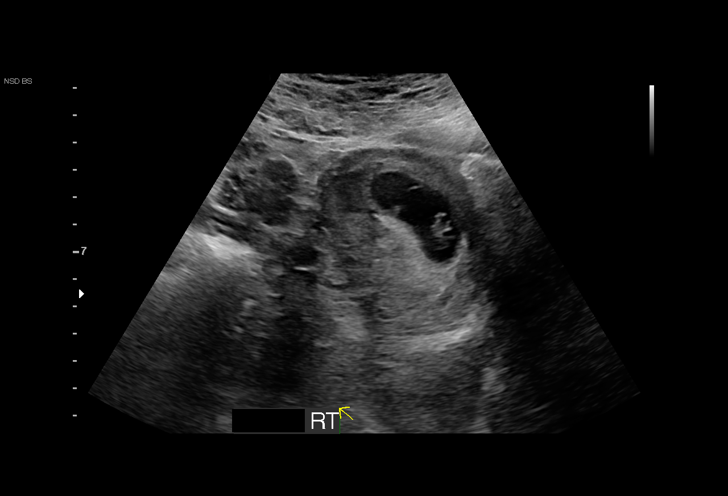
[im 10/21]
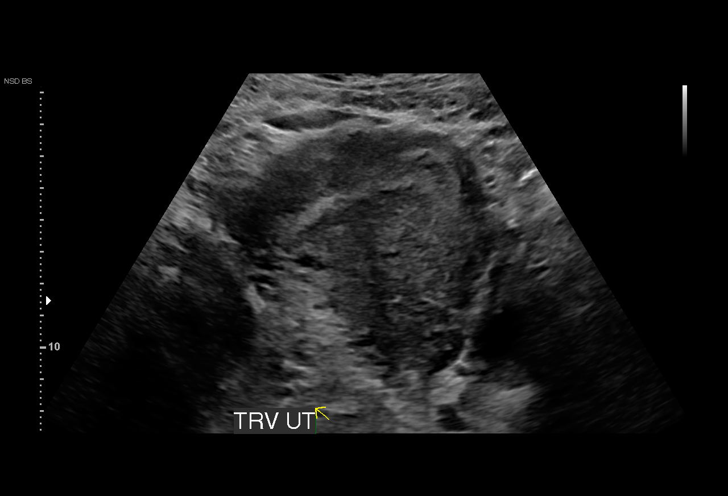
[im 11/21]
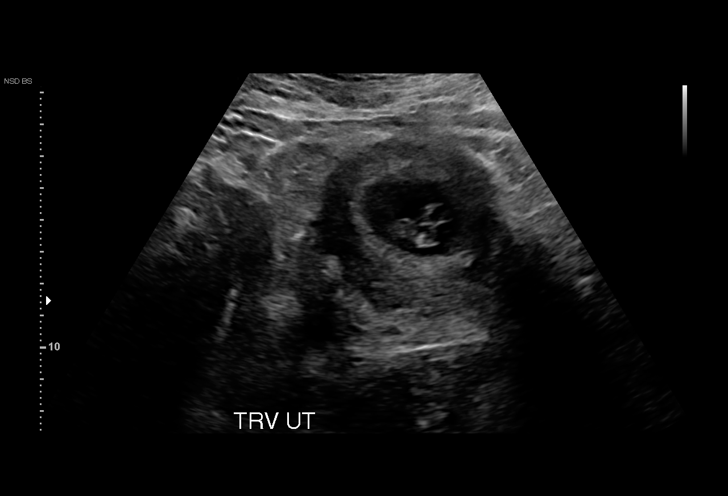
[im 12/21]
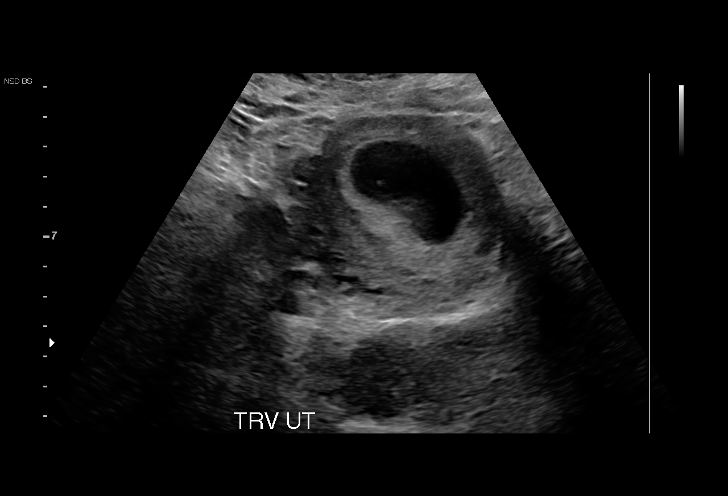
[im 14/21]
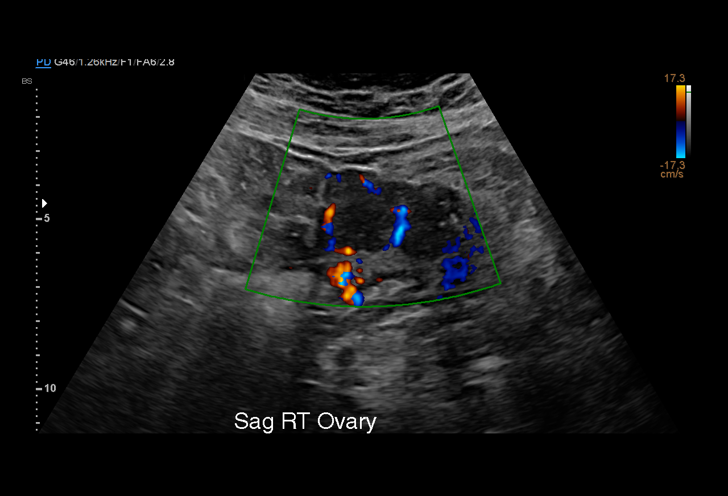
[im 15/21]
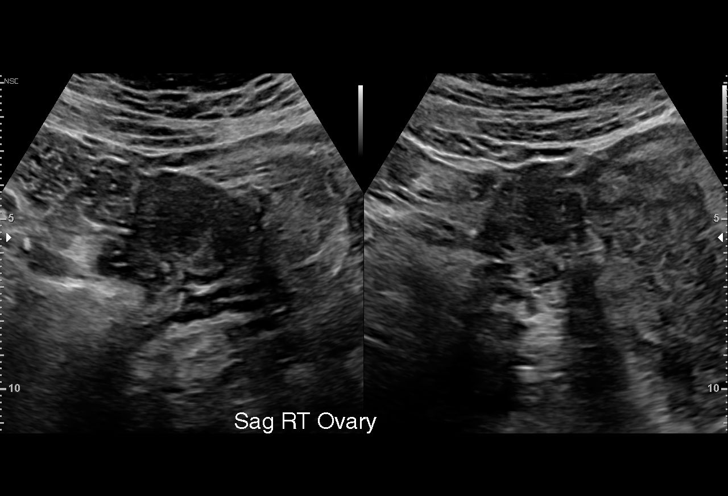
[im 17/21]
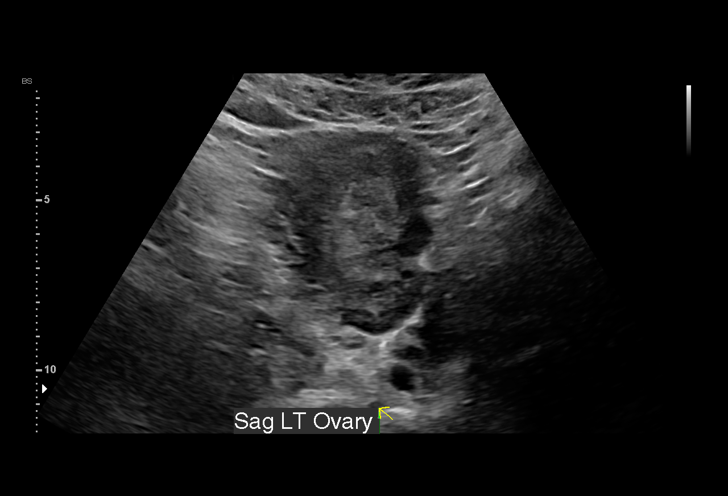
[im 18/21]
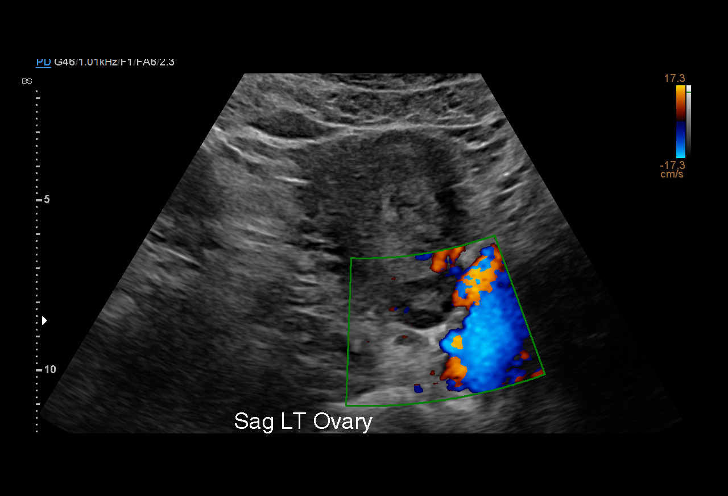
[im 19/21]
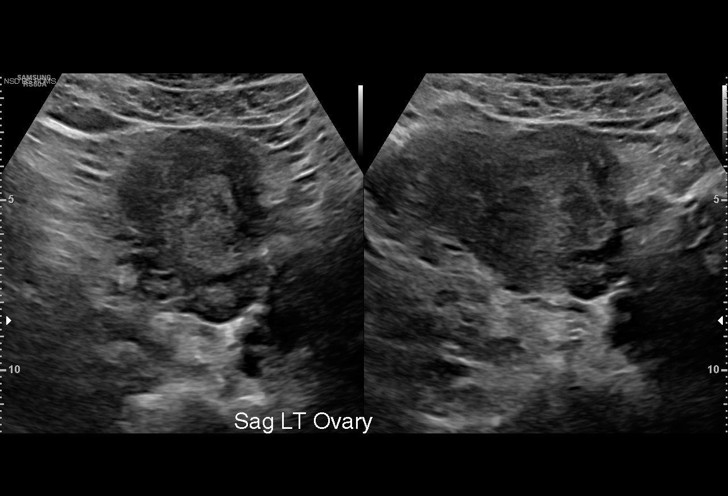
[im 21/21]
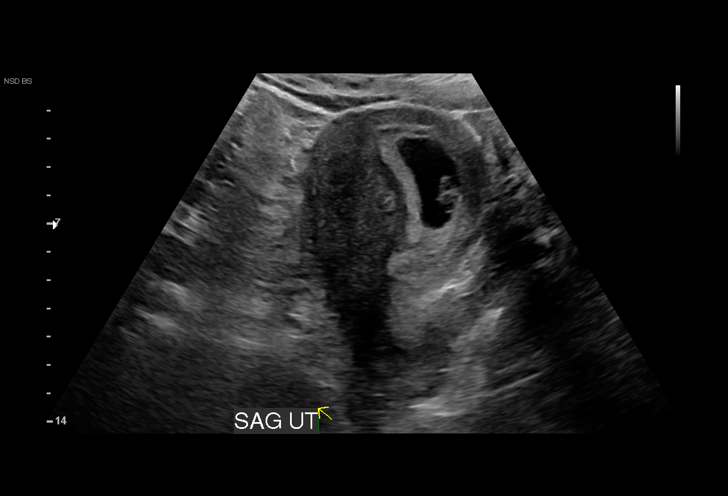

[15 of 21 positions shown; findings below may reference images not displayed]

FINDINGS: Intrauterine gestational sac: Single

Yolk sac:  Visualized.

Embryo:  Visualized.

Cardiac Activity: Visualized.

Heart Rate: 171 bpm

CRL: 20.1 mm   8 w 4 d                  US EDC: 03/14/2022

Subchorionic hemorrhage:  None visualized.

Maternal uterus/adnexae: Normal appearance of both ovaries. No free
pelvic fluid.
IMPRESSION: 1. Single living intrauterine embryo measuring 8 weeks 4 days.
2. By today's exam, EDC is 03/14/2022.

## 2024-10-07 ENCOUNTER — Ambulatory Visit: Payer: Self-pay | Admitting: *Deleted

## 2024-10-07 DIAGNOSIS — Z3201 Encounter for pregnancy test, result positive: Secondary | ICD-10-CM

## 2024-10-07 DIAGNOSIS — Z3200 Encounter for pregnancy test, result unknown: Secondary | ICD-10-CM

## 2024-10-07 LAB — POCT PREGNANCY, URINE: Preg Test, Ur: POSITIVE — AB

## 2024-10-07 NOTE — Progress Notes (Signed)
 Elaine Green dropped off a urine for a pregnancy test which was positive. I called her with Interpreter and notified her of result. She reports sure, regular LMP of 08/30/24 which makes her [redacted]w[redacted]d pregnant with EDD 06/06/25. I reviewed her meds.  I advised she start a prenatal vitamin and start prenatal care here.She plans to take OTC PNV.  She plan to go her, call transferred to front desk. Rock Skip PEAK

## 2024-11-11 ENCOUNTER — Other Ambulatory Visit (HOSPITAL_COMMUNITY)
Admission: RE | Admit: 2024-11-11 | Discharge: 2024-11-11 | Disposition: A | Discharge status: Home / Self Care | Payer: Self-pay | Source: Ambulatory Visit | Attending: Family Medicine | Admitting: Family Medicine

## 2024-11-11 ENCOUNTER — Other Ambulatory Visit: Payer: Self-pay

## 2024-11-11 ENCOUNTER — Ambulatory Visit (INDEPENDENT_AMBULATORY_CARE_PROVIDER_SITE_OTHER): Payer: Self-pay

## 2024-11-11 VITALS — BP 99/65 | HR 91 | Wt 256.1 lb

## 2024-11-11 DIAGNOSIS — O0991 Supervision of high risk pregnancy, unspecified, first trimester: Secondary | ICD-10-CM

## 2024-11-11 DIAGNOSIS — Z8632 Personal history of gestational diabetes: Secondary | ICD-10-CM

## 2024-11-11 DIAGNOSIS — O0990 Supervision of high risk pregnancy, unspecified, unspecified trimester: Secondary | ICD-10-CM | POA: Insufficient documentation

## 2024-11-11 DIAGNOSIS — Z3A10 10 weeks gestation of pregnancy: Secondary | ICD-10-CM

## 2024-11-11 NOTE — Progress Notes (Signed)
 New OB Intake  I connected with Elaine Green  on 11/11/24 at 11:15 AM EST in person [facility].  I discussed the limitations, risks, security and privacy concerns of performing an evaluation and management service by telephone and the availability of in person appointments. I also discussed with the patient that there may be a patient responsible charge related to this service. The patient expressed understanding and agreed to proceed.  I explained I am completing New OB Intake today. We discussed EDD of 06/06/25 based on LMP of 08/30/24. Pt is G4P3003. I reviewed her allergies, medications and Medical/Surgical/OB history.    Patient Active Problem List   Diagnosis Date Noted   Supervision of high risk pregnancy, antepartum 11/11/2024   History of gestational diabetes 11/11/2024   Language barrier 01/17/2022   Rubella non-immune status, antepartum 08/31/2021     Concerns addressed today Dr. Eldonna visualized FHR 152 with bedside ultrasound.   Delivery Plans Plans to deliver at [facility]. Discussed the nature of our practice with multiple providers including residents and students as well as female and female providers. Due to the size of the practice, the delivering provider may not be the same as those providing prenatal care.   Patient is not interested in water birth.  MyChart/Babyscripts MyChart access verified. I explained pt will have some visits in office and some virtually.    Blood Pressure Cuff/Weight Scale Patient is self-pay; explained patient will be given BP cuff at first prenatal appt. Explained after first prenatal appt pt will check weekly and document in Babyscripts. Patient does not have weight scale; declines to track weight weekly in Babyscripts.  Anatomy US  Explained first scheduled US  will be around 19 weeks. Anatomy US  scheduled for 01/27/25 at 08:00am.  Is patient a CenteringPregnancy candidate?  Not a Candidate  Not a candidate due to Language  barrier    Is patient a Mom+Baby Combined Care candidate?  Not a candidate     Is patient a candidate for Babyscripts Optimization? No, due to language barrier.   First visit review I reviewed new OB appt with patient. Explained pt will be seen by Dr. Nicholaus at first visit 11/16/24. Discussed Elaine Green genetic screening with patient. She desires Elaine Green and Elaine Green. Routine prenatal labs collected today.   Last Pap No results found for: DIAGPAP  Elaine Green Elaine Mckensey Berghuis, RN 11/11/2024  11:40 AM

## 2024-11-11 NOTE — Patient Instructions (Signed)

## 2024-11-12 LAB — GC/CHLAMYDIA PROBE AMP (~~LOC~~) NOT AT ARMC
Chlamydia: NEGATIVE
Comment: NEGATIVE
Comment: NORMAL
Neisseria Gonorrhea: NEGATIVE

## 2024-11-13 LAB — CBC/D/PLT+RPR+RH+ABO+RUBIGG...
Antibody Screen: NEGATIVE
Basophils Absolute: 0 x10E3/uL (ref 0.0–0.2)
Basos: 0 %
EOS (ABSOLUTE): 0.2 x10E3/uL (ref 0.0–0.4)
Eos: 2 %
HCV Ab: NONREACTIVE
HIV Screen 4th Generation wRfx: NONREACTIVE
Hematocrit: 37.7 % (ref 34.0–46.6)
Hemoglobin: 12.3 g/dL (ref 11.1–15.9)
Hepatitis B Surface Ag: NEGATIVE
Immature Grans (Abs): 0 x10E3/uL (ref 0.0–0.1)
Immature Granulocytes: 0 %
Lymphocytes Absolute: 2.5 x10E3/uL (ref 0.7–3.1)
Lymphs: 29 %
MCH: 29.9 pg (ref 26.6–33.0)
MCHC: 32.6 g/dL (ref 31.5–35.7)
MCV: 92 fL (ref 79–97)
Monocytes Absolute: 0.6 x10E3/uL (ref 0.1–0.9)
Monocytes: 7 %
Neutrophils Absolute: 5.3 x10E3/uL (ref 1.4–7.0)
Neutrophils: 62 %
Platelets: 184 x10E3/uL (ref 150–450)
RBC: 4.12 x10E6/uL (ref 3.77–5.28)
RDW: 13.1 % (ref 11.7–15.4)
RPR Ser Ql: NONREACTIVE
Rh Factor: POSITIVE
Rubella Antibodies, IGG: <0.9 {index} — ABNORMAL LOW (ref >0.99)
WBC: 8.7 x10E3/uL (ref 3.4–10.8)

## 2024-11-13 LAB — HCV INTERPRETATION

## 2024-11-14 LAB — CULTURE, OB URINE

## 2024-11-14 LAB — URINE CULTURE, OB REFLEX

## 2024-11-16 ENCOUNTER — Encounter: Payer: Self-pay | Admitting: Obstetrics and Gynecology

## 2024-11-16 ENCOUNTER — Ambulatory Visit: Payer: Self-pay | Admitting: Obstetrics and Gynecology

## 2024-11-16 DIAGNOSIS — Z3490 Encounter for supervision of normal pregnancy, unspecified, unspecified trimester: Secondary | ICD-10-CM

## 2024-11-17 ENCOUNTER — Other Ambulatory Visit: Payer: Self-pay

## 2024-11-17 ENCOUNTER — Ambulatory Visit: Payer: Self-pay | Admitting: Family Medicine

## 2024-11-17 ENCOUNTER — Encounter: Payer: Self-pay | Admitting: Family Medicine

## 2024-11-17 VITALS — BP 119/76 | HR 85 | Wt 253.0 lb

## 2024-11-17 DIAGNOSIS — Z8632 Personal history of gestational diabetes: Secondary | ICD-10-CM

## 2024-11-17 DIAGNOSIS — Z603 Acculturation difficulty: Secondary | ICD-10-CM

## 2024-11-17 DIAGNOSIS — Z3490 Encounter for supervision of normal pregnancy, unspecified, unspecified trimester: Secondary | ICD-10-CM

## 2024-11-17 DIAGNOSIS — O0990 Supervision of high risk pregnancy, unspecified, unspecified trimester: Secondary | ICD-10-CM

## 2024-11-17 DIAGNOSIS — O0991 Supervision of high risk pregnancy, unspecified, first trimester: Secondary | ICD-10-CM

## 2024-11-17 DIAGNOSIS — Z758 Other problems related to medical facilities and other health care: Secondary | ICD-10-CM

## 2024-11-17 DIAGNOSIS — Z2839 Other underimmunization status: Secondary | ICD-10-CM

## 2024-11-17 DIAGNOSIS — Z3A11 11 weeks gestation of pregnancy: Secondary | ICD-10-CM

## 2024-11-17 LAB — HEMOGLOBIN A1C
Est. average glucose Bld gHb Est-mCnc: 111 mg/dL
Hgb A1c MFr Bld: 5.5 % (ref 4.8–5.6)

## 2024-11-17 MED ORDER — ASPIRIN 81 MG PO CHEW: 81.0000 mg | CHEWABLE_TABLET | Freq: Every day | ORAL | 3 refills | Status: AC

## 2024-11-17 NOTE — Progress Notes (Addendum)
 Subjective:   Elaine Green is a 29 y.o. [phone] at [redacted]w[redacted]d by LMP being seen today for her first obstetrical visit.  Her obstetrical history is significant for obesity and h/o GDM A1 last pregnancy. Patient does intend to breast feed. Pregnancy history fully reviewed.  Patient reports no complaints.  HISTORY: OB History  Gravida Para Term Preterm AB Living  4 3 3  0 0 3  SAB IAB Ectopic Multiple Live Births  0 0 0 0 3    # Outcome Date GA Lbr Len/2nd Weight Sex Type Anes PTL Lv  4 Current           3 Term 02/28/22 [redacted]w[redacted]d 00:37 / 00:05 7 lb 5.5 oz (3.33 kg) M Vag-Spont Local  LIV     Complications: Gestational diabetes     Name: FUMIYE, LUBBEN [facility]     Apgar1: 8  Apgar5: 9  2 Term 07/04/17 [redacted]w[redacted]d 06:26 / 00:05 7 lb 6.7 oz (3.365 kg) F Vag-Spont Local  LIV     Birth Comments: WNL     Name: JUDEE, HENNICK [facility]     Apgar1: 9  Apgar5: 9  1 Term 10/14/13 [redacted]w[redacted]d 19:17 / 00:34 7 lb 0.4 oz (3.185 kg) F Vag-Spont Local  LIV     Name: MORALES MARTINEZ,GIRL Carletta     Apgar1: 9  Apgar5: 9   Past Medical History:  Diagnosis Date   BMI 40.0-44.9, adult (HCC) 01/17/2022   Gestational diabetes    Loss of taste 01/17/2022   Past Surgical History:  Procedure Laterality Date   NO PAST SURGERIES     Family History  Problem Relation Age of Onset   Cancer Neg Hx    Diabetes Neg Hx    Hypertension Neg Hx    Social History   Tobacco Use   Smoking status: Never   Smokeless tobacco: Never  Vaping Use   Vaping status: Never Used  Substance Use Topics   Alcohol use: No   Drug use: No   No Known Allergies Current Outpatient Medications on File Prior to Visit  Medication Sig Dispense Refill   Prenatal Vit-Fe Fumarate-FA (MULTIVITAMIN-PRENATAL) 27-0.8 MG TABS tablet Take 1 tablet by mouth daily at 12 noon.     No current facility-administered medications on file prior to visit.     Exam   Vitals:   11/17/24 0946  BP: 119/76  Pulse: 85  Weight: 253  lb (114.8 kg)   Fetal Heart Rate (bpm): 155  System: General: well-developed, well-nourished female in no acute distress   Skin: normal coloration and turgor, no rashes   Neurologic: oriented, normal, negative, normal mood   Extremities: normal strength, tone, and muscle mass, ROM of all joints is normal   HEENT PERRLA, extraocular movement intact and sclera clear, anicteric   Mouth/Teeth mucous membranes moist, pharynx normal without lesions and dental hygiene good   Neck supple and no masses   Cardiovascular: regular rate and rhythm   Respiratory:  no respiratory distress, normal breath sounds   Abdomen: soft, non-tender; bowel sounds normal; no masses,  no organomegaly      11/17/2024    9:43 AM 11/11/2024   12:33 PM 02/26/2022    9:19 AM 02/19/2022   11:15 AM  GAD 7 : Generalized Anxiety Score  Nervous, Anxious, on Edge 0 0 0 0  Control/stop worrying 0 0 0 0  Worry too much - different things 0 0 0 0  Trouble relaxing 0 0 0  0  Restless 0 0 0 0  Easily annoyed or irritable 0 0 0 0  Afraid - awful might happen 0 0 0 0  Total GAD 7 Score 0 0 0 0    Flowsheet Row Initial Prenatal from [facility]17/2024 in [facility]  PHQ-9 Total Score 0      Assessment:   Pregnancy: [id number] Patient Active Problem List   Diagnosis Date Noted   Supervision of high risk pregnancy, antepartum 11/11/2024   History of gestational diabetes 11/11/2024   Language barrier 01/17/2022   Rubella non-immune status, antepartum 08/31/2021     Plan:  1. Supervision of high risk pregnancy, antepartum (Primary) Refer to BCCCP for pap smear New OB labs reviewed - Hemoglobin A1c - PANORAMA PRENATAL TEST - HORIZON Basic Panel - aspirin 81 MG chewable tablet; Chew 1 tablet (81 mg total) by mouth daily.  Dispense: 90 tablet; Refill: 3  2. [redacted] weeks gestation of pregnancy   3. Rubella non-immune status, antepartum Needs MMR pp  4. Language  barrier Spanish interpreter: Therisa used  5. History of gestational diabetes Baseline A1C   Initial labs drawn. Continue prenatal vitamins. Genetic Screening discussed, NIPS: ordered. Ultrasound discussed; fetal anatomic survey: ordered. Problem list reviewed and updated.   Routine obstetric precautions reviewed. Return in about 4 weeks (around 12/15/2024).

## 2024-11-18 ENCOUNTER — Ambulatory Visit: Payer: Self-pay | Admitting: Family Medicine

## 2024-11-18 DIAGNOSIS — O0990 Supervision of high risk pregnancy, unspecified, unspecified trimester: Secondary | ICD-10-CM

## 2024-11-22 LAB — PANORAMA PRENATAL TEST FULL PANEL:PANORAMA TEST PLUS 5 ADDITIONAL MICRODELETIONS: FETAL FRACTION: 5.7

## 2024-11-23 LAB — HORIZON CUSTOM: REPORT SUMMARY: NEGATIVE

## 2024-12-15 ENCOUNTER — Ambulatory Visit: Payer: Self-pay | Admitting: Certified Nurse Midwife

## 2024-12-15 NOTE — Progress Notes (Unsigned)
 PRENATAL VISIT NOTE  Subjective:  Elaine Green is a 29 y.o. [phone] at [redacted]w[redacted]d being seen today for ongoing prenatal care.  She is currently monitored for the following issues for this {Blank single:19197::high-risk,low-risk} pregnancy and has Rubella non-immune status, antepartum; Language barrier; Supervision of high risk pregnancy, antepartum; and History of gestational diabetes on their problem list.  Patient reports {sx:14538}.   .  .   . Denies leaking of fluid.   The following portions of the patient's history were reviewed and updated as appropriate: allergies, current medications, past family history, past medical history, past social history, past surgical history and problem list.   Objective:   There were no vitals filed for this visit.  Fetal Status:           General: Alert, oriented and cooperative. Patient is in no acute distress.  Skin: Skin is warm and dry. No rash noted.   Cardiovascular: Normal heart rate noted  Respiratory: Normal respiratory effort, no problems with respiration noted  Abdomen: Soft, gravid, appropriate for gestational age.        Pelvic: {Blank single:19197::Cervical exam performed in the presence of a chaperone,Cervical exam deferred}        Extremities: Normal range of motion.     Mental Status: Normal mood and affect. Normal behavior. Normal judgment and thought content.      11/17/2024    9:42 AM 11/11/2024   12:33 PM 02/26/2022    9:19 AM  Depression screen PHQ 2/9  Decreased Interest 0 0 0  Down, Depressed, Hopeless 0 0 0  PHQ - 2 Score 0 0 0  Altered sleeping 0 0 0  Tired, decreased energy 0 0 0  Change in appetite 0 0 0  Feeling bad or failure about yourself  0 0 0  Trouble concentrating 0 0 0  Moving slowly or fidgety/restless 0 0 0  Suicidal thoughts 0 0 0  PHQ-9 Score 0 0 0      Data saved with a previous flowsheet row definition        11/17/2024    9:43 AM 11/11/2024   12:33 PM 02/26/2022     9:19 AM 02/19/2022   11:15 AM  GAD 7 : Generalized Anxiety Score  Nervous, Anxious, on Edge 0 0 0 0  Control/stop worrying 0 0 0 0  Worry too much - different things 0 0 0 0  Trouble relaxing 0 0 0 0  Restless 0 0 0 0  Easily annoyed or irritable 0 0 0 0  Afraid - awful might happen 0 0 0 0  Total GAD 7 Score 0 0 0 0    Assessment and Plan:  Pregnancy: G4P3003 at [redacted]w[redacted]d 1. Supervision of high risk pregnancy, antepartum (Primary) - Doing well, feeling regular and vigorous fetal movement  2. [redacted] weeks gestation of pregnancy - Routine PNC, anticipatory guidance.   3. Language barrier - *** interpreter used for entire episode of care.    4. History of gestational diabetes - HgbA1c 5.5 last visit.   Preterm labor symptoms and general obstetric precautions including but not limited to vaginal bleeding, contractions, leaking of fluid and fetal movement were reviewed in detail with the patient. Please refer to After Visit Summary for other counseling recommendations.   No follow-ups on file.  Future Appointments  Date Time Provider Department Center  12/15/2024  1:55 PM Regino Camie DELENA EDDY [facility]  01/27/2025  8:00 AM [facility] PROVIDER 1 [facility]  01/27/2025  8:30 AM [facility] US3 [facility]    Camie DELENA Rote, CNM

## 2024-12-16 ENCOUNTER — Inpatient Hospital Stay (HOSPITAL_COMMUNITY)
Admission: AD | Admit: 2024-12-16 | Discharge: 2024-12-17 | Disposition: A | Discharge status: Home / Self Care | Payer: Self-pay | Attending: Obstetrics & Gynecology | Admitting: Obstetrics & Gynecology

## 2024-12-16 DIAGNOSIS — G5600 Carpal tunnel syndrome, unspecified upper limb: Secondary | ICD-10-CM | POA: Insufficient documentation

## 2024-12-16 DIAGNOSIS — O26899 Other specified pregnancy related conditions, unspecified trimester: Secondary | ICD-10-CM

## 2024-12-16 DIAGNOSIS — R519 Headache, unspecified: Secondary | ICD-10-CM

## 2024-12-16 DIAGNOSIS — O26892 Other specified pregnancy related conditions, second trimester: Secondary | ICD-10-CM

## 2024-12-16 DIAGNOSIS — Z3A15 15 weeks gestation of pregnancy: Secondary | ICD-10-CM

## 2024-12-16 DIAGNOSIS — O99352 Diseases of the nervous system complicating pregnancy, second trimester: Secondary | ICD-10-CM | POA: Insufficient documentation

## 2024-12-16 LAB — CBC WITH DIFFERENTIAL/PLATELET
Abs Immature Granulocytes: 0.03 K/uL (ref 0.00–0.07)
Basophils Absolute: 0.1 K/uL (ref 0.0–0.1)
Basophils Relative: 0 %
Eosinophils Absolute: 0.3 K/uL (ref 0.0–0.5)
Eosinophils Relative: 3 %
HCT: 33.5 % — ABNORMAL LOW (ref 36.0–46.0)
Hemoglobin: 11.5 g/dL — ABNORMAL LOW (ref 12.0–15.0)
Immature Granulocytes: 0 %
Lymphocytes Relative: 32 %
Lymphs Abs: 3.7 K/uL (ref 0.7–4.0)
MCH: 29.8 pg (ref 26.0–34.0)
MCHC: 34.3 g/dL (ref 30.0–36.0)
MCV: 86.8 fL (ref 80.0–100.0)
Monocytes Absolute: 0.8 K/uL (ref 0.1–1.0)
Monocytes Relative: 7 %
Neutro Abs: 6.5 K/uL (ref 1.7–7.7)
Neutrophils Relative %: 58 %
Platelets: 187 K/uL (ref 150–400)
RBC: 3.86 MIL/uL — ABNORMAL LOW (ref 3.87–5.11)
RDW: 13.2 % (ref 11.5–15.5)
WBC: 11.3 K/uL — ABNORMAL HIGH (ref 4.0–10.5)
nRBC: 0 % (ref 0.0–0.2)

## 2024-12-16 LAB — URINALYSIS, ROUTINE W REFLEX MICROSCOPIC
Bilirubin Urine: NEGATIVE
Glucose, UA: NEGATIVE mg/dL
Hgb urine dipstick: NEGATIVE
Ketones, ur: NEGATIVE mg/dL
Leukocytes,Ua: NEGATIVE
Nitrite: NEGATIVE
Protein, ur: NEGATIVE mg/dL
Specific Gravity, Urine: 1.01 (ref 1.005–1.030)
pH: 6 (ref 5.0–8.0)

## 2024-12-16 LAB — BASIC METABOLIC PANEL WITH GFR
Anion gap: 10 (ref 5–15)
BUN: 9 mg/dL (ref 6–20)
CO2: 21 mmol/L — ABNORMAL LOW (ref 22–32)
Calcium: 9 mg/dL (ref 8.9–10.3)
Chloride: 105 mmol/L (ref 98–111)
Creatinine, Ser: 0.7 mg/dL (ref 0.44–1.00)
GFR, Estimated: >60 mL/min (ref >60)
Glucose, Bld: 91 mg/dL (ref 70–99)
Potassium: 3.7 mmol/L (ref 3.5–5.1)
Sodium: 135 mmol/L (ref 135–145)

## 2024-12-16 MED ORDER — METOCLOPRAMIDE HCL 10 MG PO TABS: 10.0000 mg | ORAL_TABLET | Freq: Four times a day (QID) | ORAL | 0 refills | Status: AC | PRN

## 2024-12-16 MED ORDER — METOCLOPRAMIDE HCL 10 MG PO TABS
10.0000 mg | ORAL_TABLET | Freq: Once | ORAL | Status: AC
  Administered 2024-12-16: 23:00:00 10 mg via ORAL
  Filled 2024-12-16: qty 1

## 2024-12-16 MED ORDER — ACETAMINOPHEN-CAFFEINE 500-65 MG PO TABS: 2.0000 | ORAL_TABLET | Freq: Four times a day (QID) | ORAL | 0 refills | Status: AC | PRN

## 2024-12-16 MED ORDER — ACETAMINOPHEN-CAFFEINE 500-65 MG PO TABS
1.0000 | ORAL_TABLET | Freq: Once | ORAL | Status: AC
  Administered 2024-12-16: 23:00:00 1 via ORAL
  Filled 2024-12-16: qty 1

## 2024-12-16 NOTE — MAU Provider Note (Signed)
 Chief Complaint:  Headache   HPI   None     Elaine Green is a 29 y.o. [phone] at [redacted]w[redacted]d who presents to maternity admissions reporting headache. She reports frequent headaches since finding out she was pregnant. Overall, headaches have worsened. She took Tylenol  500 mg at 1720 but still has headache pain. She also reports that she feels numbness on part of her head. She endorses nausea/vomiting with the headaches as well as photophobia. She notes hand cramps each morning after waking up that improves after massage. Denies vaginal bleeding, leaking of fluid, abdominal pain.   Due to language barrier, a virtual interpreter [phone] was present during the history-taking, physical exam, and subsequent discussion with this patient.    Pregnancy Course: Receives care at [facility] . Prenatal records reviewed. Missed prenatal appointment yesterday, lost phone and has not been able to reschedule.  Past Medical History:  Diagnosis Date   BMI 40.0-44.9, adult (HCC) 01/17/2022   Gestational diabetes    Loss of taste 01/17/2022   OB History  Gravida Para Term Preterm AB Living  4 3 3  0 0 3  SAB IAB Ectopic Multiple Live Births  0 0 0 0 3    # Outcome Date GA Lbr Len/2nd Weight Sex Type Anes PTL Lv  4 Current           3 Term 02/28/22 [redacted]w[redacted]d 00:37 / 00:05 3330 g M Vag-Spont Local  LIV     Complications: Gestational diabetes  2 Term 07/04/17 [redacted]w[redacted]d 06:26 / 00:05 3365 g F Vag-Spont Local  LIV     Birth Comments: WNL  1 Term 10/14/13 [redacted]w[redacted]d 19:17 / 00:34 3185 g F Vag-Spont Local  LIV   Past Surgical History:  Procedure Laterality Date   NO PAST SURGERIES     Family History  Problem Relation Age of Onset   Cancer Neg Hx    Diabetes Neg Hx    Hypertension Neg Hx    Social History[1] Allergies[2] Medications Prior to Admission  Medication Sig Dispense Refill Last Dose/Taking   Prenatal Vit-Fe Fumarate-FA (MULTIVITAMIN-PRENATAL) 27-0.8 MG  TABS tablet Take 1 tablet by mouth daily at 12 noon.   12/15/2024   aspirin  81 MG chewable tablet Chew 1 tablet (81 mg total) by mouth daily. 90 tablet 3     I have reviewed patient's Past Medical Hx, Surgical Hx, Family Hx, Social Hx, medications and allergies.   ROS  Pertinent items noted in HPI and remainder of comprehensive ROS otherwise negative.   PHYSICAL EXAM  Patient Vitals for the past 24 hrs:  BP Temp Temp src Pulse Resp SpO2 Height Weight  12/16/24 2134 109/68 98.4 F (36.9 C) Oral 82 16 100 % 5' 1 (1.549 m) 113.9 kg    Constitutional: Well-developed, well-nourished female in no acute distress.  HEENT: atraumatic, normocephalic. Neck has normal ROM. EOM intact. Cardiovascular: normal rate & rhythm, warm and well-perfused Respiratory: normal effort, no problems with respiration noted GI: Abd soft, non-tender, non-distended MSK: Extremities nontender, no edema, normal ROM Skin: warm and dry. Acyanotic, no jaundice or pallor. Neurologic: Alert and oriented x 4. No abnormal coordination. Psychiatric: Normal mood. Speech not slurred, not rapid/pressured. Patient is cooperative.  Labs: Results for orders placed or performed during the hospital encounter of 12/16/24 (from the past 24 hours)  Urinalysis, Routine w reflex microscopic -Urine, Clean Catch     Status: Abnormal   Collection Time: 12/16/24  9:29 PM  Result Value  Ref Range   Color, Urine YELLOW YELLOW   APPearance HAZY (A) CLEAR   Specific Gravity, Urine 1.010 1.005 - 1.030   pH 6.0 5.0 - 8.0   Glucose, UA NEGATIVE NEGATIVE mg/dL   Hgb urine dipstick NEGATIVE NEGATIVE   Bilirubin Urine NEGATIVE NEGATIVE   Ketones, ur NEGATIVE NEGATIVE mg/dL   Protein, ur NEGATIVE NEGATIVE mg/dL   Nitrite NEGATIVE NEGATIVE   Leukocytes,Ua NEGATIVE NEGATIVE  CBC with Differential/Platelet     Status: Abnormal   Collection Time: 12/16/24 10:14 PM  Result Value Ref Range   WBC 11.3 (H) 4.0 - 10.5 K/uL   RBC 3.86 (L) 3.87 -  5.11 MIL/uL   Hemoglobin 11.5 (L) 12.0 - 15.0 g/dL   HCT 66.4 (L) 63.9 - 53.9 %   MCV 86.8 80.0 - 100.0 fL   MCH 29.8 26.0 - 34.0 pg   MCHC 34.3 30.0 - 36.0 g/dL   RDW 86.7 88.4 - 84.4 %   Platelets 187 150 - 400 K/uL   nRBC 0.0 0.0 - 0.2 %   Neutrophils Relative % 58 %   Neutro Abs 6.5 1.7 - 7.7 K/uL   Lymphocytes Relative 32 %   Lymphs Abs 3.7 0.7 - 4.0 K/uL   Monocytes Relative 7 %   Monocytes Absolute 0.8 0.1 - 1.0 K/uL   Eosinophils Relative 3 %   Eosinophils Absolute 0.3 0.0 - 0.5 K/uL   Basophils Relative 0 %   Basophils Absolute 0.1 0.0 - 0.1 K/uL   Immature Granulocytes 0 %   Abs Immature Granulocytes 0.03 0.00 - 0.07 K/uL  Basic metabolic panel     Status: Abnormal   Collection Time: 12/16/24 10:14 PM  Result Value Ref Range   Sodium 135 135 - 145 mmol/L   Potassium 3.7 3.5 - 5.1 mmol/L   Chloride 105 98 - 111 mmol/L   CO2 21 (L) 22 - 32 mmol/L   Glucose, Bld 91 70 - 99 mg/dL   BUN 9 6 - 20 mg/dL   Creatinine, Ser 9.29 0.44 - 1.00 mg/dL   Calcium 9.0 8.9 - 89.6 mg/dL   GFR, Estimated >39 >39 mL/min   Anion gap 10 5 - 15    Imaging:  No results found.  MDM & MAU COURSE  MDM: Moderate  MAU Course: -Vital signs within normal limits. -Excedrin Tension and Reglan  for headache. With associated nausea/vomiting, may be migraine type headache. -CBC and CMP given frequent headaches. -CBC with mild anemia. Otherwise within normal limits for pregnancy. -CMP within normal limits. -Headache improved after medication.  Differential diagnosis considered for headache includes but is not limited to: preeclampsia, tension headache, cluster, trauma, concussion, migraine, CVA/SAH, viral syndrome  Orders Placed This Encounter  Procedures   Urinalysis, Routine w reflex microscopic -Urine, Clean Catch   CBC with Differential/Platelet   Basic metabolic panel   Discharge patient   Meds ordered this encounter  Medications   acetaminophen -caffeine  (EXCEDRIN TENSION  HEADACHE) 500-65 MG per tablet 1 tablet   metoCLOPramide  (REGLAN ) tablet 10 mg   metoCLOPramide  (REGLAN ) 10 MG tablet    Sig: Take 1 tablet (10 mg total) by mouth every 6 (six) hours as needed (for headache or nausea).    Dispense:  1 tablet    Refill:  0   acetaminophen -caffeine  (EXCEDRIN TENSION HEADACHE) 500-65 MG TABS per tablet    Sig: Take 2 tablets by mouth every 6 (six) hours as needed (for headache).    Dispense:  60 tablet  Refill:  0    ASSESSMENT   1. Headache in pregnancy, antepartum, second trimester   2. Carpal tunnel syndrome during pregnancy   3. [redacted] weeks gestation of pregnancy     PLAN  Discharge home in stable condition with return precautions.  Tylenol  and Reglan  as needed for headache at home. Start daily magnesium supplement for prevention. Discussed wrist braces for carpal tunnel syndrome.     Follow-up Information     [facility] Follow up.   Specialty: Obstetrics and Gynecology Why: As scheduled for ongoing prenatal care Contact information: [address] [place]  [id number] [phone]                 Allergies as of 12/16/2024   No Known Allergies      Medication List     TAKE these medications    acetaminophen -caffeine  500-65 MG Tabs per tablet Commonly known as: EXCEDRIN TENSION HEADACHE Take 2 tablets by mouth every 6 (six) hours as needed (for headache).   aspirin  81 MG chewable tablet Chew 1 tablet (81 mg total) by mouth daily.   metoCLOPramide  10 MG tablet Commonly known as: REGLAN  Take 1 tablet (10 mg total) by mouth every 6 (six) hours as needed (for headache or nausea).   multivitamin-prenatal 27-0.8 MG Tabs tablet Take 1 tablet by mouth daily at 12 noon.        Joesph DELENA Sear, PA      [1]  Social History Tobacco Use   Smoking status: Never   Smokeless tobacco: Never  Vaping Use   Vaping status: Never Used  Substance Use  Topics   Alcohol use: No   Drug use: No  [2] No Known Allergies

## 2024-12-16 NOTE — Discharge Instructions (Signed)
 DOLOR DE CABEZA: Si le da dolor de cabeza en casa, le recomiendo lo siguiente: 1) Beba 1 litro de sports coach. Asegrese de clinical cytogeneticist. 2) Tome 1000 mg de Tylenol  (2 pastillas extra fuertes) y metoclopramide  (Reglan ) 10 mg.  3) Si lo necesita, tambin puede tomar Benadryl . Probablemente le d mucho sueo. Tome una siesta, ya que dormir puede ayudar a engineer, materials de cabeza.  Si su dolor de cabeza no mejora despus de probar [employer], acuda a la [facility] ([facility]).   CARPAL TUNNEL: Recomendamos comprar algo como esto y usarlos en ambas muecas, especialmente de noche, pero tantas horas del da como puedas.     Razones para volver a MAU/[facility]: Las contracciones son cada 5 minutos o menos, cada uno de los ltimos 1 minuto, stos han llevado a cabo durante 1-2 horas, y no se hydrographic surveyor o hablar durante ellas Usted tiene un gran chorro de lquido o un goteo de lquido que no se detiene y hay que usar una toalla Ha de sangrado que es de color rojo brillante, denso que el manchado - como sangrado menstrual (manchado puede ser normal en trabajo de parto prematuro o despus de una comprobacin de su cuello uterino) Usted no se siente el beb se mueve como l / ella hace normalmente

## 2024-12-16 NOTE — MAU Note (Signed)
..  Elaine Green is a 29 y.o. at [redacted]w[redacted]d here in MAU reporting:  Headaches that she has had since she found out she was pregnant, reports that the headaches have worsened. Took 500 mg tylenol  around 1720 for headache, but still has pain now. Reports that she feels like part of her head is numb.  Nausea/ vomiting that comes with the headaches.  Had a prenatal visit yesterday but was not able to go and has not rescheduled because she lost her phone. Every morning she wakes up with cramping in her hands, goes away with massaging her hands.  Denies vaginal bleeding, leaking of fluid, or lower abdominal pain.  Pain score: 5/10 Vitals:   12/16/24 2134  BP: 109/68  Pulse: 82  Resp: 16  Temp: 98.4 F (36.9 C)  SpO2: 100%     FHT:150 Lab orders placed from triage: [facility]

## 2025-01-13 DIAGNOSIS — O99210 Obesity complicating pregnancy, unspecified trimester: Secondary | ICD-10-CM | POA: Insufficient documentation

## 2025-01-14 ENCOUNTER — Ambulatory Visit: Payer: Self-pay | Admitting: Obstetrics and Gynecology

## 2025-01-14 ENCOUNTER — Other Ambulatory Visit: Payer: Self-pay

## 2025-01-14 VITALS — BP 118/67 | HR 89 | Wt 248.6 lb

## 2025-01-14 DIAGNOSIS — O0992 Supervision of high risk pregnancy, unspecified, second trimester: Secondary | ICD-10-CM

## 2025-01-14 DIAGNOSIS — Z3490 Encounter for supervision of normal pregnancy, unspecified, unspecified trimester: Secondary | ICD-10-CM

## 2025-01-14 DIAGNOSIS — Z3A19 19 weeks gestation of pregnancy: Secondary | ICD-10-CM

## 2025-01-14 DIAGNOSIS — Z8632 Personal history of gestational diabetes: Secondary | ICD-10-CM

## 2025-01-14 DIAGNOSIS — Z603 Acculturation difficulty: Secondary | ICD-10-CM

## 2025-01-14 DIAGNOSIS — Z758 Other problems related to medical facilities and other health care: Secondary | ICD-10-CM

## 2025-01-14 DIAGNOSIS — O0990 Supervision of high risk pregnancy, unspecified, unspecified trimester: Secondary | ICD-10-CM

## 2025-01-14 DIAGNOSIS — Z2839 Other underimmunization status: Secondary | ICD-10-CM

## 2025-01-14 NOTE — Progress Notes (Signed)
" ° °  PRENATAL VISIT NOTE  Subjective:  Elaine Green is a 30 y.o. [phone] at [redacted]w[redacted]d being seen today for ongoing prenatal care.  She is currently monitored for the following issues for this low-risk pregnancy and has Rubella non-immune status, antepartum; Language barrier; Supervision of high risk pregnancy, antepartum; History of gestational diabetes; Headache in pregnancy, antepartum, second trimester; and Obesity affecting pregnancy, antepartum on their problem list.  Patient feels unwell with fatigue, malaise and cough today. She reports fatigue and cough with possible flu symptoms.  Contractions: Irritability. Vag. Bleeding: None.  Movement: Absent. Denies leaking of fluid.   The following portions of the patient's history were reviewed and updated as appropriate: allergies, current medications, past family history, past medical history, past social history, past surgical history and problem list. Problem list updated.  Objective:   Vitals:   01/14/25 1612  BP: 118/67  Pulse: 89  Weight: 248 lb 9.6 oz (112.8 kg)    Fetal Status: Fetal Heart Rate (bpm): 151   Movement: Absent     General:  Alert, oriented and cooperative. Patient is in no acute distress.  Skin: Skin is warm and dry. No rash noted.   Cardiovascular: Normal heart rate noted  Respiratory: Normal respiratory effort, no problems with respiration noted  Abdomen: Soft, gravid, appropriate for gestational age.  Pain/Pressure: Present     Pelvic: Cervical exam deferred        Extremities: Normal range of motion.     Mental Status:  Normal mood and affect. Normal behavior. Normal judgment and thought content.   Assessment and Plan:  Pregnancy: G4P3003 at [redacted]w[redacted]d  1. Supervision of high risk pregnancy, antepartum (Primary) Continue routine prenatal care Pt advised to go to urgent care or MAU for further eval of possible flu. - AFP, Serum, Open Spina Bifida  2. [redacted] weeks gestation of pregnancy   3. Rubella  non-immune status, antepartum Treat after delivery  4. Language barrier Live interpreter present  5. History of gestational diabetes   Preterm labor symptoms and general obstetric precautions including but not limited to vaginal bleeding, contractions, leaking of fluid and fetal movement were reviewed in detail with the patient.  Please refer to After Visit Summary for other counseling recommendations.   Return in about 4 weeks (around 02/11/2025) for ROB, in person.   Jerilynn Buddle, MD Faculty Attending Center for [facility]   "

## 2025-01-16 LAB — AFP, SERUM, OPEN SPINA BIFIDA
AFP MoM: 0.71
AFP Value: 25.6 ng/mL
Gest. Age on Collection Date: 19 wk
Maternal Age At EDD: 29.4 a
OSBR Risk 1 IN: 10000
Test Results:: NEGATIVE
Weight: 249 [lb_av]

## 2025-01-18 ENCOUNTER — Ambulatory Visit: Payer: Self-pay | Admitting: Obstetrics and Gynecology

## 2025-01-27 ENCOUNTER — Ambulatory Visit: Payer: Self-pay | Attending: Obstetrics and Gynecology

## 2025-01-27 ENCOUNTER — Ambulatory Visit: Payer: Self-pay | Admitting: Obstetrics

## 2025-01-27 VITALS — BP 101/53 | HR 83

## 2025-01-27 DIAGNOSIS — O0990 Supervision of high risk pregnancy, unspecified, unspecified trimester: Secondary | ICD-10-CM | POA: Insufficient documentation

## 2025-01-27 DIAGNOSIS — Z3A21 21 weeks gestation of pregnancy: Secondary | ICD-10-CM

## 2025-01-27 DIAGNOSIS — O09292 Supervision of pregnancy with other poor reproductive or obstetric history, second trimester: Secondary | ICD-10-CM

## 2025-01-27 DIAGNOSIS — O99212 Obesity complicating pregnancy, second trimester: Secondary | ICD-10-CM

## 2025-01-27 DIAGNOSIS — O99210 Obesity complicating pregnancy, unspecified trimester: Secondary | ICD-10-CM | POA: Insufficient documentation

## 2025-01-27 DIAGNOSIS — E669 Obesity, unspecified: Secondary | ICD-10-CM

## 2025-01-27 DIAGNOSIS — Z8632 Personal history of gestational diabetes: Secondary | ICD-10-CM | POA: Insufficient documentation

## 2025-01-27 DIAGNOSIS — O43192 Other malformation of placenta, second trimester: Secondary | ICD-10-CM

## 2025-01-27 NOTE — Progress Notes (Signed)
 MFM Consult Note  Elaine Green is currently at [redacted]w[redacted]d. She was seen today for a detailed fetal anatomy scan due to maternal obesity with a BMI of 48.4.  She denies any significant past medical history and denies any problems in her current pregnancy.    She had a cell free DNA test earlier in her pregnancy which indicated a low risk for trisomy 26, 28, and 13. A female fetus is predicted.   Sonographic findings Single intrauterine pregnancy at 21w 3d. Fetal cardiac activity:  Observed and appears normal. Presentation: Breech. The views of the fetal anatomy were limited today due to maternal body habitus.  What was visualized today appeared within normal limits. Fetal biometry shows the estimated fetal weight of 1 lb 1 oz, 472 grams (77%). Amniotic fluid: Within normal limits.  MVP: 5.96 cm. Placenta: Circumvallate placenta- posterior. Adnexa: No abnormality visualized. Cervical length: 5 cm.  A possible circumvallate placenta was noted on today's exam.    The patient was informed that anomalies may be missed due to technical limitations. If the fetus is in a suboptimal position or maternal habitus is increased, visualization of the fetus in the maternal uterus may be impaired.  Maternal obesity  Due to maternal obesity, we will continue to follow her with growth ultrasounds throughout her pregnancy.  Weekly fetal testing should be started at 34 weeks and continued until delivery.    Delivery should occur at around 39 weeks.    As women with an elevated BMI may be at increased risk for developing preeclampsia, she should continue taking a daily baby aspirin  for preeclampsia prophylaxis.  A follow-up growth scan was scheduled in 4 weeks.    All conversations were held with the patient today with the help of a Spanish interpreter.  The patient stated that all of her questions were answered.   A total of 30 minutes was spent counseling and coordinating the care for  this patient.  Greater than 50% of the time was spent in direct face-to-face contact.

## 2025-02-10 ENCOUNTER — Encounter: Payer: Self-pay | Admitting: Family Medicine

## 2025-02-25 ENCOUNTER — Other Ambulatory Visit: Payer: Self-pay

## 2025-02-25 ENCOUNTER — Ambulatory Visit: Payer: Self-pay

## 2025-03-11 ENCOUNTER — Other Ambulatory Visit: Payer: Self-pay

## 2025-03-30 ENCOUNTER — Encounter: Payer: Self-pay | Admitting: Physician Assistant
